# Patient Record
Sex: Female | Born: 1955 | Race: Black or African American | Hispanic: No | Smoking: Never smoker
Health system: Southern US, Community
[De-identification: ages and names within clinical notes are randomized; demographics above are authoritative.]

## PROBLEM LIST (undated history)

## (undated) DIAGNOSIS — K2100 Gastro-esophageal reflux disease with esophagitis, without bleeding: Secondary | ICD-10-CM

## (undated) DIAGNOSIS — E119 Type 2 diabetes mellitus without complications: Secondary | ICD-10-CM

## (undated) DIAGNOSIS — I10 Essential (primary) hypertension: Secondary | ICD-10-CM

## (undated) DIAGNOSIS — H269 Unspecified cataract: Secondary | ICD-10-CM

## (undated) DIAGNOSIS — M1990 Unspecified osteoarthritis, unspecified site: Secondary | ICD-10-CM

## (undated) DIAGNOSIS — E785 Hyperlipidemia, unspecified: Secondary | ICD-10-CM

## (undated) DIAGNOSIS — E559 Vitamin D deficiency, unspecified: Secondary | ICD-10-CM

## (undated) DIAGNOSIS — N201 Calculus of ureter: Secondary | ICD-10-CM

## (undated) DIAGNOSIS — K219 Gastro-esophageal reflux disease without esophagitis: Secondary | ICD-10-CM

## (undated) DIAGNOSIS — E669 Obesity, unspecified: Secondary | ICD-10-CM

## (undated) HISTORY — DX: Gastro-esophageal reflux disease without esophagitis: K21.9

## (undated) HISTORY — DX: Obesity, unspecified: E66.9

## (undated) HISTORY — DX: Vitamin D deficiency, unspecified: E55.9

## (undated) HISTORY — PX: CHOLECYSTECTOMY: SHX55

## (undated) HISTORY — DX: Gastro-esophageal reflux disease with esophagitis: K21.0

## (undated) HISTORY — PX: OTHER SURGICAL HISTORY: SHX169

## (undated) HISTORY — DX: Gastro-esophageal reflux disease with esophagitis, without bleeding: K21.00

## (undated) HISTORY — DX: Unspecified cataract: H26.9

## (undated) HISTORY — PX: HAND SURGERY: SHX662

## (undated) HISTORY — DX: Essential (primary) hypertension: I10

## (undated) HISTORY — DX: Hyperlipidemia, unspecified: E78.5

## (undated) HISTORY — DX: Unspecified osteoarthritis, unspecified site: M19.90

---
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
## 1998-04-19 ENCOUNTER — Ambulatory Visit (HOSPITAL_COMMUNITY): Admission: RE | Admit: 1998-04-19 | Discharge: 1998-04-19 | Payer: Self-pay | Admitting: *Deleted

## 1998-04-21 ENCOUNTER — Other Ambulatory Visit: Admission: RE | Admit: 1998-04-21 | Discharge: 1998-04-21 | Payer: Self-pay | Admitting: *Deleted

## 1998-05-13 ENCOUNTER — Other Ambulatory Visit: Admission: RE | Admit: 1998-05-13 | Discharge: 1998-05-13 | Payer: Self-pay | Admitting: *Deleted

## 1998-09-17 ENCOUNTER — Ambulatory Visit (HOSPITAL_COMMUNITY): Admission: RE | Admit: 1998-09-17 | Discharge: 1998-09-17 | Payer: Self-pay | Admitting: Internal Medicine

## 1999-02-11 ENCOUNTER — Ambulatory Visit (HOSPITAL_COMMUNITY): Admission: RE | Admit: 1999-02-11 | Discharge: 1999-02-11 | Payer: Self-pay | Admitting: Internal Medicine

## 2000-12-11 ENCOUNTER — Encounter: Payer: Self-pay | Admitting: Internal Medicine

## 2000-12-11 ENCOUNTER — Encounter: Admission: RE | Admit: 2000-12-11 | Discharge: 2000-12-11 | Payer: Self-pay | Admitting: Internal Medicine

## 2001-12-19 ENCOUNTER — Encounter: Admission: RE | Admit: 2001-12-19 | Discharge: 2001-12-19 | Payer: Self-pay | Admitting: Internal Medicine

## 2001-12-19 ENCOUNTER — Encounter: Payer: Self-pay | Admitting: Internal Medicine

## 2001-12-25 ENCOUNTER — Emergency Department (HOSPITAL_COMMUNITY): Admission: EM | Admit: 2001-12-25 | Discharge: 2001-12-25 | Payer: Self-pay | Admitting: *Deleted

## 2001-12-25 ENCOUNTER — Encounter: Payer: Self-pay | Admitting: *Deleted

## 2002-01-02 ENCOUNTER — Encounter: Payer: Self-pay | Admitting: Internal Medicine

## 2002-01-02 ENCOUNTER — Ambulatory Visit (HOSPITAL_COMMUNITY): Admission: RE | Admit: 2002-01-02 | Discharge: 2002-01-02 | Payer: Self-pay | Admitting: Internal Medicine

## 2003-09-27 ENCOUNTER — Encounter: Admission: RE | Admit: 2003-09-27 | Discharge: 2003-09-27 | Payer: Self-pay | Admitting: Internal Medicine

## 2003-09-27 ENCOUNTER — Encounter: Payer: Self-pay | Admitting: Internal Medicine

## 2003-12-15 ENCOUNTER — Other Ambulatory Visit: Admission: RE | Admit: 2003-12-15 | Discharge: 2003-12-15 | Payer: Self-pay | Admitting: *Deleted

## 2004-12-05 ENCOUNTER — Other Ambulatory Visit: Admission: RE | Admit: 2004-12-05 | Discharge: 2004-12-05 | Payer: Self-pay | Admitting: *Deleted

## 2005-10-14 ENCOUNTER — Emergency Department (HOSPITAL_COMMUNITY): Admission: EM | Admit: 2005-10-14 | Discharge: 2005-10-14 | Payer: Self-pay | Admitting: Emergency Medicine

## 2006-01-25 ENCOUNTER — Emergency Department (HOSPITAL_COMMUNITY): Admission: EM | Admit: 2006-01-25 | Discharge: 2006-01-26 | Payer: Self-pay | Admitting: Emergency Medicine

## 2006-03-29 ENCOUNTER — Emergency Department (HOSPITAL_COMMUNITY): Admission: EM | Admit: 2006-03-29 | Discharge: 2006-03-29 | Payer: Self-pay | Admitting: Family Medicine

## 2007-09-02 ENCOUNTER — Other Ambulatory Visit: Admission: RE | Admit: 2007-09-02 | Discharge: 2007-09-02 | Payer: Self-pay | Admitting: *Deleted

## 2008-10-25 ENCOUNTER — Other Ambulatory Visit: Admission: RE | Admit: 2008-10-25 | Discharge: 2008-10-25 | Payer: Self-pay | Admitting: Gynecology

## 2009-02-07 ENCOUNTER — Encounter: Admission: RE | Admit: 2009-02-07 | Discharge: 2009-02-07 | Payer: Self-pay | Admitting: Internal Medicine

## 2009-02-15 ENCOUNTER — Inpatient Hospital Stay (HOSPITAL_COMMUNITY): Admission: AD | Admit: 2009-02-15 | Discharge: 2009-02-17 | Payer: Self-pay | Admitting: Internal Medicine

## 2009-02-16 ENCOUNTER — Encounter (INDEPENDENT_AMBULATORY_CARE_PROVIDER_SITE_OTHER): Payer: Self-pay | Admitting: General Surgery

## 2009-05-28 ENCOUNTER — Emergency Department (HOSPITAL_COMMUNITY): Admission: EM | Admit: 2009-05-28 | Discharge: 2009-05-28 | Payer: Self-pay | Admitting: Family Medicine

## 2010-02-27 ENCOUNTER — Encounter: Admission: RE | Admit: 2010-02-27 | Discharge: 2010-02-27 | Payer: Self-pay | Admitting: Gynecology

## 2010-08-22 ENCOUNTER — Encounter: Admission: RE | Admit: 2010-08-22 | Discharge: 2010-08-22 | Payer: Self-pay | Admitting: Gynecology

## 2011-01-07 ENCOUNTER — Encounter: Payer: Self-pay | Admitting: Gynecology

## 2011-02-12 ENCOUNTER — Emergency Department (HOSPITAL_COMMUNITY): Admission: EM | Admit: 2011-02-12 | Payer: Self-pay | Source: Home / Self Care

## 2011-02-26 ENCOUNTER — Other Ambulatory Visit: Payer: Self-pay | Admitting: Gynecology

## 2011-02-26 DIAGNOSIS — Z09 Encounter for follow-up examination after completed treatment for conditions other than malignant neoplasm: Secondary | ICD-10-CM

## 2011-03-16 ENCOUNTER — Ambulatory Visit
Admission: RE | Admit: 2011-03-16 | Discharge: 2011-03-16 | Disposition: A | Discharge status: Home / Self Care | Payer: Medicaid Other | Source: Ambulatory Visit | Attending: Gynecology | Admitting: Gynecology

## 2011-03-16 DIAGNOSIS — Z09 Encounter for follow-up examination after completed treatment for conditions other than malignant neoplasm: Secondary | ICD-10-CM

## 2011-03-29 LAB — COMPREHENSIVE METABOLIC PANEL
ALT: 25 U/L (ref 0–35)
ALT: 56 U/L — ABNORMAL HIGH (ref 0–35)
AST: 53 U/L — ABNORMAL HIGH (ref 0–37)
Alkaline Phosphatase: 56 U/L (ref 39–117)
CO2: 23 mEq/L (ref 19–32)
Calcium: 8.6 mg/dL (ref 8.4–10.5)
Chloride: 103 mEq/L (ref 96–112)
GFR calc Af Amer: >60 mL/min (ref >60)
Glucose, Bld: 93 mg/dL (ref 70–99)
Potassium: 2.8 mEq/L — ABNORMAL LOW (ref 3.5–5.1)
Potassium: 3 mEq/L — ABNORMAL LOW (ref 3.5–5.1)
Sodium: 134 mEq/L — ABNORMAL LOW (ref 135–145)
Sodium: 137 mEq/L (ref 135–145)
Total Bilirubin: 0.8 mg/dL (ref 0.3–1.2)
Total Protein: 7.1 g/dL (ref 6.0–8.3)
Total Protein: 8.4 g/dL — ABNORMAL HIGH (ref 6.0–8.3)

## 2011-03-29 LAB — DIFFERENTIAL
Basophils Absolute: 0 10*3/uL (ref 0.0–0.1)
Basophils Relative: 0 % (ref 0–1)
Eosinophils Absolute: 0 10*3/uL (ref 0.0–0.7)
Monocytes Relative: 5 % (ref 3–12)
Neutrophils Relative %: 49 % (ref 43–77)

## 2011-03-29 LAB — CBC
HCT: 43.3 % (ref 36.0–46.0)
Hemoglobin: 14.8 g/dL (ref 12.0–15.0)
RBC: 4.95 MIL/uL (ref 3.87–5.11)
RDW: 14.1 % (ref 11.5–15.5)
WBC: 8.1 10*3/uL (ref 4.0–10.5)

## 2011-03-29 LAB — GLUCOSE, CAPILLARY
Glucose-Capillary: 108 mg/dL — ABNORMAL HIGH (ref 70–99)
Glucose-Capillary: 152 mg/dL — ABNORMAL HIGH (ref 70–99)

## 2011-03-29 LAB — AMYLASE: Amylase: 89 U/L (ref 27–131)

## 2011-03-29 LAB — PROTIME-INR: INR: 1 (ref 0.00–1.49)

## 2011-04-02 ENCOUNTER — Encounter: Payer: Self-pay | Admitting: Internal Medicine

## 2011-05-01 NOTE — Op Note (Signed)
NAMEAUDRA, [name]               ACCOUNT NO.:  [id number]   MEDICAL RECORD NO.:  [id number]          PATIENT TYPE:  INP   LOCATION:  5523                         FACILITY:  [facility]   PHYSICIAN:  Gabrielle Dare. Janee Morn, M.D.DATE OF BIRTH:  12-12-1956   DATE OF PROCEDURE:  02/16/2009  DATE OF DISCHARGE:                               OPERATIVE REPORT   PREOPERATIVE DIAGNOSIS:  Acute cholecystitis.   POSTOPERATIVE DIAGNOSIS:  Acute cholecystitis.   PROCEDURE:  Laparoscopic cholecystectomy with intraoperative  cholangiogram.   SURGEON:  Gabrielle Dare. Janee Morn, MD.   ASSISTANT:  Junious Silk, NP.   ANESTHESIA:  General endotracheal.   HISTORY OF PRESENT ILLNESS:  Ms. Bunker is a 55 year old African-  American female who has had episodic right upper quadrant pain.  She was  admitted by Dr. August Saucer.  Previous ultrasound demonstrated gallstones and  she continues to have right upper quadrant pain with nausea.  We are  proceeding with cholecystectomy today.   PROCEDURE IN DETAIL:  Informed consent was obtained.  The patient was  identified in the preop holding area.  She was receiving intravenous  antibiotics.  She was brought to the operating room.  General  endotracheal anesthesia was administered by the anesthesia staff.  Her  abdomen was prepped and draped in a sterile fashion.  Supraumbilical  region was infiltrated with 0.25% Marcaine with epinephrine.  Supraumbilical incision was made.  Subcutaneous tissues were dissected  down revealing the anterior fascia.  This was divided sharply and the  peritoneal cavity was entered under direct vision without difficulty.  A  0 Vicryl pursestring suture was placed around the fascial opening and a  Hasson trocar was inserted into the abdomen.  The abdomen was  insufflated with carbon dioxide under direct vision.  An 11-mm  epigastric and two 5-mm lateral ports were placed.  A 0.25% Marcaine  with epinephrine was injected at all port sites.  The dome  of the  gallbladder was retracted superomedially.  The infundibulum was  retracted inferolaterally.  Dissection began laterally and progressed  medially, easily identifying the cystic duct and the cystic artery.  Cystic duct was dissected out until a large window was created between  the cystic duct, the infundibulum, the gallbladder, and the liver.  Once  this was done with excellent visualization, a clip was placed around the  infundibular-cystic duct junction and a small nick was made in the  cystic duct.  Reddick cholangiogram catheter was inserted.  Intraoperative cholangiogram was obtained, demonstrated no common bile  filling defects and good flow of contrast into the duodenum.  Cholangiogram catheter was removed.  Three clips were placed proximally  in the cystic duct and it was divided.  The cystic artery was further  dissected out.  This was clipped twice proximally, once distally, and  divided.  The gallbladder was taken off the liver bed with Bovie cautery  achieving good hemostasis along the way, the gallbladder was placed in  an EndoCatch bag and removed from the abdomen via the supraumbilical  port site.  The liver bed was then cauterized to achieve excellent  hemostasis.  The abdomen was copiously irrigated.  Irrigation fluid  returned clear.  The liver bed was rechecked and was completely dry.  The clips remained in excellent position.  The remainder of the  irrigation fluid was evacuated and it was clear.  The ports were then  removed under direct vision.  The pneumoperitoneum was released.  The  supraumbilical fascia was closed at that time with 0 Vicryl purse-string  suture with care not to trap any intraabdominal contents.  All 4 wounds  were  copiously irrigated.  The skin edges were closed with running 4-0 Vicryl  subcuticular stitch.  Sponge, needle, and instrument counts were  correct.  Dermabond was also applied to the wound.  The patient  tolerated the  procedure well without apparent complications.  The  patient was taken to the recovery room in stable condition.      Gabrielle Dare Janee Morn, M.D.  Electronically Signed     BET/MEDQ  D:  02/16/2009  T:  02/17/2009  Job:  [id number]   cc:   Minerva Areola L. August Saucer, M.D.

## 2011-05-01 NOTE — H&P (Signed)
[name], [name]               ACCOUNT NO.:  [id number]   MEDICAL RECORD NO.:  [id number]          PATIENT TYPE:  INP   LOCATION:  5523                         FACILITY:  [facility]   PHYSICIAN:  Eric L. August Saucer, M.D.     DATE OF BIRTH:  [birth year]   DATE OF ADMISSION:  02/15/2009  DATE OF DISCHARGE:                              HISTORY & PHYSICAL   CHIEF COMPLAINT:  Increasing abdominal pain.   HISTORY OF PRESENT ILLNESS:  This is the first recent [facility] admission for this 55 year old single black female with a  history of mental retardation, esophageal reflux disease, and obesity.  She also has a mild diabetes mellitus, controlled with oral medication.  She had been doing well up until the past 2 weeks.  Family noted that  she had been complaining of intermittent upper abdominal aching-type  pain.  This was poorly described by the patient.  She, however, noted  increasing symptoms over the past week.  The patient was seen on  February 07, 2009, in the office for evaluation.  An abdominal  ultrasound did confirm multiple gallstones.  The changes were not  consistent with acute cholecystitis at that time.  Since then, the  patient has had ongoing intermittent pain.  Her diet has been modified.  She had been avoiding fried foods, red meat, and fatty foods.  She is  admitted today for further evaluation and treatment of her increasing  pain.  There has been no fever, chills, night sweats, nausea, or  vomiting.   PAST MEDICAL HISTORY:  Otherwise, notable for a history of dysfunctional  uterine bleeding for which she has been on Megace per Gynecology in the  past.  Esophageal reflux disease as noted.   The patient has no known allergies.   PRESENT MEDICATIONS:  1. Metformin 500 mg 2 tablets twice a day.  2. Amaryl 2 mg daily.  3. Maxzide 25 mg daily.  4. Nexium 40 mg daily.  5. Megace 5 mg 2 tablets a day.   PHYSICAL EXAMINATION:  GENERAL;  She is an obese black female  in mild  distress.  VITAL SIGNS:  Temperature of 99, pulse 107, respiratory rate 20, blood  pressure elevated at 171/60, and O2 saturation is 98% on room air.  Height 5 feet 4 inches and weight 99.3 kg.  HEENT:  Head is normocephalic and atraumatic without bruits.  Extraocular muscles are intact.  There is no scleral icterus.  TMs are  clear.  Nose; mild turbinate edema.  Throat; posterior pharynx is clear.  NECK:  Supple.  No enlarged thyroid.  No positive cervical nodes.  LUNGS:  Clear to auscultation.  No E to A changes.  CARDIOVASCULAR:  Normal S1 and S2.  No S3.  ABDOMEN:  Bowel sounds are present.  She does have marked right upper  quadrant tenderness.  No rebound tenderness.  There is also mild  tenderness in the left upper quadrant as well.  No increased warmth of  the abdomen noted.  EXTREMITIES:  Full range of motion.  No cyanosis, clubbing, crepitus, or  edema.  NEUROLOGIC:  She is alert and oriented to person, place, and time.  Moves all extremities.  PSYCHIATRIC:  She has slow mentation, otherwise, which is chronic.   LABORATORY DATA:  From today is pending.  Abdominal ultrasound from  February 07, 2009, showed multiple gallstones.  She had a diffuse fatty  infiltration of liver without ductal dilatation.  Portion of the  pancreas was obscured by gas at that time.   EKG demonstrates normal sinus rhythm with left axis deviation.  Poor R-  wave progression.   IMPRESSION:  1. Cholelithiasis.  Rule out early acute cholecystitis.  2. Abdominal pain secondary to above.  3. History of gastroesophageal reflux disease with negative      Helicobacter pylori titers in the past.  4. Obesity.  5. Mentally challenged, chronic.  6. History of dysfunctional uterine bleeding.   PLAN:  The patient is admitted at this time for further evaluation.  We  will put at bowel rest at this time.  IV antibiotics.  Surgical  consultation for cholecystectomy either lap cholecystectomy versus   direct.  We will monitor closely for possible acute cholecystitis.  Continue her other medications as before.            ______________________________  Lind Guest. August Saucer, M.D.     ELD/MEDQ  D:  02/15/2009  T:  02/16/2009  Job:  [id number]

## 2011-05-01 NOTE — Consult Note (Signed)
[name], BEECHAM               ACCOUNT NO.:  [id number]   MEDICAL RECORD NO.:  [id number]          PATIENT TYPE:  INP   LOCATION:  5523                         FACILITY:  [facility]   PHYSICIAN:  Ollen Gross. Vernell Morgans, M.D. DATE OF BIRTH:  Sep 09, 1956   DATE OF CONSULTATION:  02/15/2009  DATE OF DISCHARGE:                                 CONSULTATION   CHIEF COMPLAINT:  Right upper quadrant pain.   Ms. Lenk is a 55 year old black female who states that she has been  having right upper quadrant pain for years.  Pain is fairly constant,  does not really go away as to the pain has not been associated with any  nausea or vomiting.  She is not running any fevers.  Her bowels are  moving fairly regular.  She did have an ultrasound done about 8 days  ago, which showed some stones in her gallbladder, but no gallbladder  wall thickening or ductal dilatation.  She has had no recent lab work,  but she is admitted by the medical team for this.   PAST MEDICAL HISTORY:  Significant for diabetes, mental retardation.   PAST SURGICAL HISTORY:  D&C.   MEDICATIONS:  Metformin.   ALLERGIES:  No known drug allergies.   SOCIAL HISTORY:  She denies use of alcohol or tobacco products.   FAMILY HISTORY:  Noncontributory.   PHYSICAL EXAMINATION:  VITAL SIGNS:  Her temp is 99.3.  Vitals are  stable.  GENERAL:  She is a well-developed, well-nourished black female in no  acute distress.  SKIN:  Warm and dry, no jaundice.  HEENT:  Extraocular movements intact.  Pupils equal, round, and reactive  to light.  Sclerae nonicteric.  LUNGS:  Clear bilaterally with no use of accessory inspiratory muscles.  HEART:  Regular rate and rhythm with impulse in left chest.  ABDOMEN:  Soft with some mild right upper quadrant tenderness, but no  palpable mass.  No guarding, no peritonitis.  EXTREMITIES:  No cyanosis,  clubbing, or edema with good strength in arms and legs.  PSYCHOLOGICAL:  She is alert and oriented x3 with  no evidence of anxiety  or depression.   Again, she had an ultrasound done 8 days ago which showed gallstones,  but no gallbladder wall thickening or duct dilatation.  She has had no  recent lab work.   ASSESSMENT AND PLAN:  This is a 55 year old black female with what  sounds like symptomatic gallstones that has been going on for a long  time.  She will need her workup finished with liver function tests,  pancreatic enzymes,  white count, hemoglobin.  If she truly has uncomplicated gallstones  without anything else going on then she may benefit at some point from  cholecystectomy.  We will follow up on the rest of her workup and then  proceed accordingly.      Ollen Gross. Vernell Morgans, M.D.  Electronically Signed     PST/MEDQ  D:  02/15/2009  T:  02/16/2009  Job:  [id number]   cc:   Dr. August Saucer

## 2011-05-04 NOTE — Discharge Summary (Signed)
[name], [name]               ACCOUNT NO.:  [id number]   MEDICAL RECORD NO.:  [id number]          PATIENT TYPE:  INP   LOCATION:  5523                         FACILITY:  [facility]   PHYSICIAN:  Eric L. August Saucer, M.D.     DATE OF BIRTH:  1956/10/29   DATE OF ADMISSION:  02/15/2009  DATE OF DISCHARGE:  02/17/2009                               DISCHARGE SUMMARY   FINAL DIAGNOSES:  1. Cholelithiasis with acute cholecystitis, 574.00.  2. Esophageal reflux, 530.81.  3. Diabetes mellitus, type 2, non-insulin dependent, 250.00.  4. Hypertension, 401.9  5. Mental retardation, V19.  6. Hypokalemia, 276.8  7. Menstrual disorder, 626.8.  8. Obesity, 278.00.   OPERATIONS AND PROCEDURES:  1. Laparoscopic cholecystectomy per Dr. Violeta Gelinas.  2. Intraoperative cholangiogram per Dr. Violeta Gelinas.   HISTORY OF PRESENT ILLNESS:  This was the first recent [facility] admission for this 55 year old single black female with a  history of mental retardation, esophageal reflux disease, and obesity.  The patient has had mild diabetes mellitus which was controlled on oral  medication.  The patient had been doing well up until the past 2 weeks.  Family noted that she began complaining of intermittent upper abdominal  aching pain.  This was initially poorly described by the patient,  however, it became increasingly worse over the subsequent week.  The  patient was seen on February 07, 2009, in the office for evaluation.  Abdominal ultrasound did confirm multiple gallstones.  Changes, however,  were not consistent with acute cholecystitis.  Since that time, the  patient has had intermittent abdominal pain.  Her diet had been  modified, to avoid red meat, and fatty foods.  Despite this, however,  since progressed and the patient was admitted thereafter for further  evaluation.   PAST MEDICAL HISTORY AND PHYSICAL EXAM:  Per admission H&P.   HOSPITAL COURSE:  The patient is admitted for further  evaluation of  increasing abdominal pain.  She was placed on IV fluids with bedrest.  In lieu of probable cholecystitis, she was seen in consultation by  Surgery.  She was first seen by Dr. Carolynne Edouard.  On the subsequent day, she  was reevaluated by Dr. Violeta Gelinas.  It is felt that clinically she  had evidence for acute cholecystitis and need of urgent lap  cholecystectomy.  This was discussed with family and subsequently  performed on February 16, 2009.  The patient tolerated the procedure well.  On the subsequent day, she continued to progress.  Her diet was  gradually increased.  She tolerated this well.  By February 17, 2009, the  patient was ambulatory and feeling much better.  She was subsequent felt  to be stable for discharge.   DISCHARGE MEDICATIONS:  1. Metformin 500 mg b.i.d.  2. Amaryl 2 mg daily.  3. Maxzide 25 mg daily.  4. Nexium 40 mg daily.  5. Megace 5 mg 2 times daily.   The patient is to advance the diet as tolerated.  She will need to  follow up with Dr. Janee Morn in 2 weeks.  She will  be seen in the office  in 2 weeks' time for followup as well.           ______________________________  Lind Guest. August Saucer, M.D.     ELD/MEDQ  D:  04/06/2009  T:  04/07/2009  Job:  [id number]

## 2012-01-15 ENCOUNTER — Other Ambulatory Visit: Payer: Self-pay | Admitting: Gynecology

## 2012-01-15 DIAGNOSIS — Z1231 Encounter for screening mammogram for malignant neoplasm of breast: Secondary | ICD-10-CM

## 2012-03-17 ENCOUNTER — Ambulatory Visit
Admission: RE | Admit: 2012-03-17 | Discharge: 2012-03-17 | Disposition: A | Discharge status: Home / Self Care | Payer: Medicare Other | Source: Ambulatory Visit | Attending: Gynecology | Admitting: Gynecology

## 2012-03-17 ENCOUNTER — Ambulatory Visit: Payer: Medicaid Other

## 2012-03-17 DIAGNOSIS — Z1231 Encounter for screening mammogram for malignant neoplasm of breast: Secondary | ICD-10-CM

## 2012-07-28 ENCOUNTER — Ambulatory Visit
Admission: RE | Admit: 2012-07-28 | Discharge: 2012-07-28 | Disposition: A | Discharge status: Home / Self Care | Payer: Medicare Other | Source: Ambulatory Visit | Attending: Internal Medicine | Admitting: Internal Medicine

## 2012-07-28 ENCOUNTER — Other Ambulatory Visit: Payer: Self-pay | Admitting: Internal Medicine

## 2012-07-28 DIAGNOSIS — R52 Pain, unspecified: Secondary | ICD-10-CM

## 2012-10-14 LAB — HEPATIC FUNCTION PANEL
ALT: 19 U/L (ref 7–35)
Alkaline Phosphatase: 67 U/L (ref 25–125)

## 2012-10-14 LAB — BASIC METABOLIC PANEL
Creatinine: 0.9 mg/dL (ref 0.5–1.1)
Potassium: 3.7 mmol/L (ref 3.4–5.3)
Sodium: 139 mmol/L (ref 137–147)

## 2013-01-07 ENCOUNTER — Encounter: Payer: Self-pay | Admitting: Hematology

## 2015-12-29 ENCOUNTER — Emergency Department (HOSPITAL_COMMUNITY)
Admission: EM | Admit: 2015-12-29 | Discharge: 2015-12-29 | Disposition: A | Discharge status: Home / Self Care | Payer: Medicare Other | Attending: Emergency Medicine | Admitting: Emergency Medicine

## 2015-12-29 ENCOUNTER — Encounter (HOSPITAL_COMMUNITY): Payer: Self-pay | Admitting: Emergency Medicine

## 2015-12-29 DIAGNOSIS — I10 Essential (primary) hypertension: Secondary | ICD-10-CM | POA: Diagnosis not present

## 2015-12-29 DIAGNOSIS — E559 Vitamin D deficiency, unspecified: Secondary | ICD-10-CM | POA: Diagnosis not present

## 2015-12-29 DIAGNOSIS — N3000 Acute cystitis without hematuria: Secondary | ICD-10-CM | POA: Diagnosis not present

## 2015-12-29 DIAGNOSIS — R197 Diarrhea, unspecified: Secondary | ICD-10-CM | POA: Diagnosis not present

## 2015-12-29 DIAGNOSIS — Z8719 Personal history of other diseases of the digestive system: Secondary | ICD-10-CM | POA: Diagnosis not present

## 2015-12-29 DIAGNOSIS — Z7984 Long term (current) use of oral hypoglycemic drugs: Secondary | ICD-10-CM | POA: Insufficient documentation

## 2015-12-29 DIAGNOSIS — E1165 Type 2 diabetes mellitus with hyperglycemia: Secondary | ICD-10-CM | POA: Diagnosis not present

## 2015-12-29 DIAGNOSIS — Z79899 Other long term (current) drug therapy: Secondary | ICD-10-CM | POA: Insufficient documentation

## 2015-12-29 DIAGNOSIS — R739 Hyperglycemia, unspecified: Secondary | ICD-10-CM

## 2015-12-29 DIAGNOSIS — E669 Obesity, unspecified: Secondary | ICD-10-CM | POA: Diagnosis not present

## 2015-12-29 LAB — CBG MONITORING, ED
GLUCOSE-CAPILLARY: 250 mg/dL — AB (ref 65–99)
Glucose-Capillary: 264 mg/dL — ABNORMAL HIGH (ref 65–99)
Glucose-Capillary: 220 mg/dL — ABNORMAL HIGH (ref 65–99)

## 2015-12-29 LAB — CBC
HCT: 48 % — ABNORMAL HIGH (ref 36.0–46.0)
Hemoglobin: 16.2 g/dL — ABNORMAL HIGH (ref 12.0–15.0)
MCH: 29.5 pg (ref 26.0–34.0)
MCHC: 33.8 g/dL (ref 30.0–36.0)
MCV: 87.3 fL (ref 78.0–100.0)
Platelets: 290 10*3/uL (ref 150–400)
RBC: 5.5 MIL/uL — ABNORMAL HIGH (ref 3.87–5.11)
RDW: 13.1 % (ref 11.5–15.5)
WBC: 6.4 10*3/uL (ref 4.0–10.5)

## 2015-12-29 LAB — URINE MICROSCOPIC-ADD ON

## 2015-12-29 LAB — URINALYSIS, ROUTINE W REFLEX MICROSCOPIC
Glucose, UA: >1000 mg/dL — AB
Hgb urine dipstick: NEGATIVE
Ketones, ur: 15 mg/dL — AB
Leukocytes, UA: NEGATIVE
Nitrite: NEGATIVE
Protein, ur: 30 mg/dL — AB
Specific Gravity, Urine: 1.037 — ABNORMAL HIGH (ref 1.005–1.030)
pH: 5.5 (ref 5.0–8.0)

## 2015-12-29 LAB — BASIC METABOLIC PANEL
Anion gap: 12 (ref 5–15)
BUN: 14 mg/dL (ref 6–20)
CO2: 29 mmol/L (ref 22–32)
Calcium: 9.9 mg/dL (ref 8.9–10.3)
Chloride: 99 mmol/L — ABNORMAL LOW (ref 101–111)
Creatinine, Ser: 0.91 mg/dL (ref 0.44–1.00)
GFR calc Af Amer: >60 mL/min (ref >60)
GFR calc non Af Amer: >60 mL/min (ref >60)
Glucose, Bld: 260 mg/dL — ABNORMAL HIGH (ref 65–99)
Potassium: 3.3 mmol/L — ABNORMAL LOW (ref 3.5–5.1)
Sodium: 140 mmol/L (ref 135–145)

## 2015-12-29 MED ORDER — CEPHALEXIN 500 MG PO CAPS
1000.0000 mg | ORAL_CAPSULE | Freq: Two times a day (BID) | ORAL | Status: DC
Start: 2015-12-29 — End: 2019-06-23

## 2015-12-29 NOTE — Discharge Instructions (Signed)
Return here as needed.  Follow-up with your primary care doctor, you do have a urinary tract infection.  This could also be causing slight elevations in your Blood sugar increase your fluid intake, rest as much as possible

## 2015-12-29 NOTE — ED Notes (Signed)
Family states pt's glucose monitor has not been functioning properly for an unknown amount of time and when she went to the dr she had glucose in her urine and the dr wanted her to get her A1C rechecked  Family states the place she went was closed so she was going to take her somewhere in the morning to have that done but in the meantime she started having diarrhea and there was some blood noted so they decided to come here and have it all assessed

## 2015-12-29 NOTE — ED Provider Notes (Signed)
CSN: [id number]     Arrival date & time 12/29/15  0410 History   First MD Initiated Contact with Patient 12/29/15 [phone]     Chief Complaint  Patient presents with  . Hyperglycemia  . Rectal Bleeding     (Consider location/radiation/quality/duration/timing/severity/associated sxs/prior Treatment) HPI Patient presents to the emergency department with elevated blood sugar, along with diarrhea that started this morning.  The patient was seen by her primary care doctor recently due to elevated blood sugars.  Patient currently is on metformin and glimepiride for her diabetes.  The patient's history is mostly provided by her sister who is also her caregiver.  Patient does not have any chest pain, shortness of breath, weakness, dizziness, headache, blurred vision, back pain, neck pain, fever, dysuria, incontinence, bloody stool, hematemesis, edema, lethargy, lightheadedness, abdominal pain, or syncope.  The patient states nothing seems make her condition better or worse, states she had 4 episodes of diarrhea and noticed some blood mixed with the diarrhea on 2 occasions Past Medical History  Diagnosis Date  . Diabetes mellitus   . Hyperlipidemia   . Hypertension   . Unspecified vitamin D deficiency   . Obesity, unspecified   . Reflux esophagitis    Past Surgical History  Procedure Laterality Date  . Cholecystectomy     Family History  Problem Relation Age of Onset  . Diabetes Mother   . Hypertension Mother   . Diabetes Father   . Hypertension Father    Social History  Substance Use Topics  . Smoking status: Never Smoker   . Smokeless tobacco: None  . Alcohol Use: No   OB History    No data available     Review of Systems  All other systems negative except as documented in the HPI. All pertinent positives and negatives as reviewed in the HPI.  Allergies  Review of patient's allergies indicates no known allergies.  Home Medications   Prior to Admission medications    Medication Sig Start Date End Date Taking? Authorizing Provider  cholecalciferol (VITAMIN D) 1000 units tablet Take 1,000 Units by mouth daily.   Yes Historical Provider, MD  glimepiride (AMARYL) 2 MG tablet Take 2 mg by mouth daily with breakfast.   Yes Historical Provider, MD  ibuprofen (ADVIL,MOTRIN) 200 MG tablet Take 400 mg by mouth every 6 (six) hours as needed for fever, headache, mild pain, moderate pain or cramping.   Yes Historical Provider, MD  metFORMIN (GLUCOPHAGE) 1000 MG tablet Take 1,000 mg by mouth 2 (two) times daily with a meal.    Yes Historical Provider, MD  triamterene-hydrochlorothiazide (MAXZIDE-25) 37.5-25 MG per tablet Take 1 tablet by mouth daily.   Yes Historical Provider, MD   BP 126/75 mmHg  Pulse 84  Temp(Src) 98.4 F (36.9 C) (Oral)  Resp 20  Ht 5\' 5"  (1.651 m)  Wt 86.183 kg  BMI 31.62 kg/m2  SpO2 98% Physical Exam  Constitutional: She is oriented to person, place, and time. She appears well-developed and well-nourished. No distress.  HENT:  Head: Normocephalic and atraumatic.  Mouth/Throat: Oropharynx is clear and moist.  Eyes: Pupils are equal, round, and reactive to light.  Neck: Normal range of motion. Neck supple.  Cardiovascular: Normal rate, regular rhythm and normal heart sounds.  Exam reveals no gallop and no friction rub.   No murmur heard. Pulmonary/Chest: Effort normal and breath sounds normal. No respiratory distress. She has no wheezes.  Abdominal: Soft. Bowel sounds are normal. She exhibits no distension. There is no  tenderness.  Neurological: She is alert and oriented to person, place, and time. She exhibits normal muscle tone. Coordination normal.  Skin: Skin is warm and dry. No rash noted. No erythema.  Psychiatric: She has a normal mood and affect. Her behavior is normal.  Nursing note and vitals reviewed.   ED Course  Procedures (including critical care time) Labs Review Labs Reviewed  BASIC METABOLIC PANEL - Abnormal; Notable  for the following:    Potassium 3.3 (*)    Chloride 99 (*)    Glucose, Bld 260 (*)    All other components within normal limits  CBC - Abnormal; Notable for the following:    RBC 5.50 (*)    Hemoglobin 16.2 (*)    HCT 48.0 (*)    All other components within normal limits  URINALYSIS, ROUTINE W REFLEX MICROSCOPIC (NOT AT [facility]) - Abnormal; Notable for the following:    APPearance CLOUDY (*)    Specific Gravity, Urine 1.037 (*)    Glucose, UA >1000 (*)    Bilirubin Urine SMALL (*)    Ketones, ur 15 (*)    Protein, ur 30 (*)    All other components within normal limits  URINE MICROSCOPIC-ADD ON - Abnormal; Notable for the following:    Squamous Epithelial / LPF 0-5 (*)    Bacteria, UA MANY (*)    Casts HYALINE CASTS (*)    All other components within normal limits  CBG MONITORING, ED - Abnormal; Notable for the following:    Glucose-Capillary 264 (*)    All other components within normal limits  CBG MONITORING, ED - Abnormal; Notable for the following:    Glucose-Capillary 250 (*)    All other components within normal limits  CBG MONITORING, ED - Abnormal; Notable for the following:    Glucose-Capillary 220 (*)    All other components within normal limits    Imaging Review No results found. I have personally reviewed and evaluated these images and lab results as part of my medical decision-making.   EKG Interpretation None      Patient has a urinary tract infection with patient follow-up with her primary care Dr. told to return here as needed.  Patient agrees the plan and all questions were answered.  I feel that this scant amount of blood noted in the diarrhea was due to the irritation of the area rather than any GI bleeding.  She has not had any further episodes.  The patient's vital signs remained stable.  She is not hypotensive or tachycardic.    Charlestine NightChristopher Nivan Melendrez, PA-C 12/29/15 1229  Raeford RazorStephen Kohut, MD 12/29/15 1240

## 2015-12-30 LAB — URINE CULTURE

## 2018-08-31 ENCOUNTER — Encounter (HOSPITAL_COMMUNITY): Payer: Self-pay

## 2018-08-31 ENCOUNTER — Other Ambulatory Visit: Payer: Self-pay

## 2018-08-31 ENCOUNTER — Ambulatory Visit (HOSPITAL_COMMUNITY)
Admission: EM | Admit: 2018-08-31 | Discharge: 2018-08-31 | Disposition: A | Discharge status: Home / Self Care | Payer: Medicare Other | Attending: Internal Medicine | Admitting: Internal Medicine

## 2018-08-31 ENCOUNTER — Ambulatory Visit (INDEPENDENT_AMBULATORY_CARE_PROVIDER_SITE_OTHER): Payer: Medicare Other

## 2018-08-31 DIAGNOSIS — S91301A Unspecified open wound, right foot, initial encounter: Secondary | ICD-10-CM

## 2018-08-31 DIAGNOSIS — S91331A Puncture wound without foreign body, right foot, initial encounter: Secondary | ICD-10-CM | POA: Diagnosis not present

## 2018-08-31 DIAGNOSIS — W268XXA Contact with other sharp object(s), not elsewhere classified, initial encounter: Secondary | ICD-10-CM

## 2018-08-31 NOTE — ED Triage Notes (Signed)
Pt states she has a spot under her right foot x 2 week.

## 2018-08-31 NOTE — ED Provider Notes (Signed)
[facility]    CSN: [id number] Arrival date & time: 08/31/18  4098     History   Chief Complaint Chief Complaint  Patient presents with  . Foot Pain    HPI [name] is a 62 y.o. female.   62 year old female with history of DM, HLD, HTN comes in with family members for 2-day history of foot pain.  Patient stepped on a toothpick 2 weeks ago.  At that time, so primary care provider, and was given Augmentin for the symptoms.  Patient had been acting normal until 2 days ago, started limping due to pain.  Family member states unsure if foreign body is still in the foot, but was advised by PCP that we could get an x-ray.  No surrounding erythema, warmth.  Denies fever, chills, night sweats.  No obvious numbness, tingling.  Prior to foot pain starting, had not do anything to the area apart from taking Augmentin.  Family member states, has a refill on the Augmentin, and patient started back on it 2 days ago.     Past Medical History:  Diagnosis Date  . Diabetes mellitus   . Hyperlipidemia   . Hypertension   . Obesity, unspecified   . Reflux esophagitis   . Unspecified vitamin D deficiency     There are no active problems to display for this patient.   Past Surgical History:  Procedure Laterality Date  . CHOLECYSTECTOMY      OB History   None      Home Medications    Prior to Admission medications   Medication Sig Start Date End Date Taking? Authorizing Provider  amoxicillin (AMOXIL) 875 MG tablet Take 875 mg by mouth 2 (two) times daily.   Yes [provider]  cephALEXin (KEFLEX) 500 MG capsule Take 2 capsules (1,000 mg total) by mouth 2 (two) times daily. Patient not taking: Reported on 08/31/2018 12/29/15   Charlestine Night, PA-C  cholecalciferol (VITAMIN D) 1000 units tablet Take 1,000 Units by mouth daily.    [provider]  glimepiride (AMARYL) 2 MG tablet Take 2 mg by mouth daily with breakfast.    [provider]    ibuprofen (ADVIL,MOTRIN) 200 MG tablet Take 400 mg by mouth every 6 (six) hours as needed for fever, headache, mild pain, moderate pain or cramping.    [provider]  metFORMIN (GLUCOPHAGE) 1000 MG tablet Take 1,000 mg by mouth 2 (two) times daily with a meal.     [provider]  triamterene-hydrochlorothiazide (MAXZIDE-25) 37.5-25 MG per tablet Take 1 tablet by mouth daily.    [provider]    Family History Family History  Problem Relation Age of Onset  . Diabetes Mother   . Hypertension Mother   . Diabetes Father   . Hypertension Father     Social History Social History   Tobacco Use  . Smoking status: Never Smoker  Substance Use Topics  . Alcohol use: No  . Drug use: No     Allergies   Patient has no known allergies.   Review of Systems Review of Systems  Reason unable to perform ROS: See HPI as above.     Physical Exam Triage Vital Signs ED Triage Vitals  Enc Vitals Group     BP 08/31/18 1012 (!) 143/76     Pulse Rate 08/31/18 1012 87     Resp 08/31/18 1012 18     Temp 08/31/18 1012 98.7 F (37.1 C)  Temp src --      SpO2 08/31/18 1012 98 %     Weight 08/31/18 1013 155 lb (70.3 kg)     Height --      Head Circumference --      Peak Flow --      Pain Score --      Pain Loc --      Pain Edu? --      Excl. in GC? --    No data found.  Updated Vital Signs BP (!) 143/76 (BP Location: Left Arm)   Pulse 87   Temp 98.7 F (37.1 C)   Resp 18   Wt 155 lb (70.3 kg)   SpO2 98%   BMI 25.79 kg/m   Physical Exam  Constitutional: She is oriented to person, place, and time. She appears well-developed and well-nourished. No distress.  HENT:  Head: Normocephalic and atraumatic.  Eyes: Pupils are equal, round, and reactive to light. Conjunctivae are normal.  Musculoskeletal:  Puncture site seen to the right plantar surface 3cm from heel. Small hematoma around puncture site seen. No erythema, warmth.   Sensation intact.  Pedal pulse 2+, cap refill <2s  Neurological: She is alert and oriented to person, place, and time.  Skin: She is not diaphoretic.    UC Treatments / Results  Labs (all labs ordered are listed, but only abnormal results are displayed) Labs Reviewed - No data to display  EKG None  Radiology Dg Foot Complete Right  Result Date: 08/31/2018 CLINICAL DATA:  Open wound at plantar aspect near the base of the RIGHT fifth metatarsal bone. Pain. Diabetic. EXAM: RIGHT FOOT COMPLETE - 3+ VIEW COMPARISON:  None. FINDINGS: Skin marker placed at the site of patient's wound. Osseous and soft tissue structures adjacent to the skin marker are unremarkable. No destructive change to suggest osteomyelitis. No fracture line or displaced fracture fragment. No soft tissue gas. Remainder the osseous structures of the RIGHT foot appear intact and normal in mineralization. No acute appearing osseous abnormality. IMPRESSION: No acute findings. Specifically, osseous and soft tissue structures adjacent to the wound site (skin marker in place) are unremarkable. Electronically Signed   By: Bary RichardStan  Maynard M.D.   On: 08/31/2018 11:02    Procedures Procedures (including critical care time)  Medications Ordered in UC Medications - No data to display  Initial Impression / Assessment and Plan / UC Course  I have reviewed the triage vital signs and the nursing notes.  Pertinent labs & imaging results that were available during my care of the patient were reviewed by me and considered in my medical decision making (see chart for details).    X-ray did not show obvious foreign body.  Will have patient dress area with donut dressing to relieve pressure.  Warm compress to help with hematoma.  Patient establish with a podiatrist, to follow-up if symptoms not improving.  Return precautions given.  Family members expresses understanding and agrees to plan.  Final Clinical Impressions(s) / UC Diagnoses   Final diagnoses:  Wound  of right foot    ED Prescriptions    None        Belinda FisherYu, Amy V, PA-C 08/31/18 1151

## 2018-08-31 NOTE — Discharge Instructions (Addendum)
Xray without obvious foreign body. Continue augmentin as directed by PCP. Get Corn bandage/donut dressing to help keep pressure off the area. Warm soaks. Can take ibuprofen for pain. Follow up with podiatry for further evaluation if symptoms not improving.

## 2019-02-26 ENCOUNTER — Other Ambulatory Visit: Payer: Self-pay | Admitting: Internal Medicine

## 2019-02-26 ENCOUNTER — Ambulatory Visit
Admission: RE | Admit: 2019-02-26 | Discharge: 2019-02-26 | Disposition: A | Discharge status: Home / Self Care | Payer: Medicare Other | Source: Ambulatory Visit | Attending: Internal Medicine | Admitting: Internal Medicine

## 2019-02-26 ENCOUNTER — Other Ambulatory Visit: Payer: Self-pay

## 2019-02-26 DIAGNOSIS — M25551 Pain in right hip: Secondary | ICD-10-CM

## 2019-04-20 ENCOUNTER — Ambulatory Visit (INDEPENDENT_AMBULATORY_CARE_PROVIDER_SITE_OTHER): Payer: Medicare Other | Admitting: Cardiology

## 2019-04-20 ENCOUNTER — Encounter: Payer: Self-pay | Admitting: Cardiology

## 2019-04-20 ENCOUNTER — Other Ambulatory Visit: Payer: Self-pay

## 2019-04-20 VITALS — BP 125/80 | HR 91 | Temp 97.3°F | Ht 65.0 in | Wt 190.1 lb

## 2019-04-20 DIAGNOSIS — R002 Palpitations: Secondary | ICD-10-CM

## 2019-04-20 DIAGNOSIS — IMO0002 Reserved for concepts with insufficient information to code with codable children: Secondary | ICD-10-CM

## 2019-04-20 DIAGNOSIS — R9431 Abnormal electrocardiogram [ECG] [EKG]: Secondary | ICD-10-CM | POA: Diagnosis not present

## 2019-04-20 DIAGNOSIS — Z794 Long term (current) use of insulin: Secondary | ICD-10-CM

## 2019-04-20 DIAGNOSIS — I10 Essential (primary) hypertension: Secondary | ICD-10-CM

## 2019-04-20 DIAGNOSIS — E1165 Type 2 diabetes mellitus with hyperglycemia: Secondary | ICD-10-CM

## 2019-04-20 NOTE — Progress Notes (Signed)
Primary Physician/Referring:  Rogers Blocker, MD  Patient ID: [name], female    DOB: 12-19-1955, 63 y.o.   MRN: [id number]  Chief Complaint  Patient presents with  . Abnormal ECG    pt c/o palpitations on and off for 3 weeks   . New Patient (Initial Visit)  . Palpitations    HPI: [name]  is a 63 y.o. female  with hypertension, uncontrolled diabetes, vitamin D deficiency, referred to [place] on a urgent basis by PCP, Dr. Kevan Ny, for evaluation of abnormal EKG.  Except for palpitations that occur when patient gets excited or anxious and describes as racing lasting for a few seconds, patient is asymptomatic. She denies any chest pain, dyspnea on exertion, leg edema, PND, orthopnea, or symptoms suggestive of TIA or claudication. Does have right hip pain found to be related to arthritis.   Patient is fairly active around the house and does do some walking around the house and up and down the driveway. Sister denies any difficulty with doing this.   Denies any former tobacco use. Has family history positive for heart disease in her mother at age 60.   Patient has memory impairment disorder and is cared for by her sister. Sister is present at bedside.  Past Medical History:  Diagnosis Date  . Diabetes mellitus   . Hyperlipidemia   . Hypertension   . Obesity, unspecified   . Reflux esophagitis   . Unspecified vitamin D deficiency     Past Surgical History:  Procedure Laterality Date  . CHOLECYSTECTOMY      Social History   Socioeconomic History  . Marital status: Single    Spouse name: Not on file  . Number of children: Not on file  . Years of education: Not on file  . Highest education level: Not on file  Occupational History  . Not on file  Social Needs  . Financial resource strain: Not on file  . Food insecurity:    Worry: Not on file    Inability: Not on file  . Transportation needs:    Medical: Not on file    Non-medical: Not on file  Tobacco Use  .  Smoking status: Never Smoker  Substance and Sexual Activity  . Alcohol use: No  . Drug use: No  . Sexual activity: Not on file  Lifestyle  . Physical activity:    Days per week: Not on file    Minutes per session: Not on file  . Stress: Not on file  Relationships  . Social connections:    Talks on phone: Not on file    Gets together: Not on file    Attends religious service: Not on file    Active member of club or organization: Not on file    Attends meetings of clubs or organizations: Not on file    Relationship status: Not on file  . Intimate partner violence:    Fear of current or ex partner: Not on file    Emotionally abused: Not on file    Physically abused: Not on file    Forced sexual activity: Not on file  Other Topics Concern  . Not on file  Social History Narrative  . Not on file    Current Outpatient Medications on File Prior to Visit  Medication Sig Dispense Refill  . benazepril (LOTENSIN) 20 MG tablet Take 1 tablet by mouth daily.    . cholecalciferol (VITAMIN D) 1000 units tablet Take 1,000 Units by  mouth daily.    Marland Kitchen ibuprofen (ADVIL,MOTRIN) 200 MG tablet Take 400 mg by mouth every 6 (six) hours as needed for fever, headache, mild pain, moderate pain or cramping.    Marland Kitchen LEVEMIR FLEXTOUCH 100 UNIT/ML Pen Inject 15-19 Units as directed 2 (two) times a day.    . metFORMIN (GLUCOPHAGE) 1000 MG tablet Take 1,000 mg by mouth 2 (two) times daily with a meal.     . omeprazole (PRILOSEC) 20 MG capsule Take 20 mg by mouth daily.    . Potassium Chloride ER 20 MEQ TBCR Take 20 mEq by mouth daily.    . rosuvastatin (CRESTOR) 5 MG tablet Take 5 mg by mouth daily.    Marland Kitchen triamterene-hydrochlorothiazide (MAXZIDE-25) 37.5-25 MG per tablet Take 1 tablet by mouth daily.    Marland Kitchen amoxicillin (AMOXIL) 875 MG tablet Take 875 mg by mouth 2 (two) times daily.    . cephALEXin (KEFLEX) 500 MG capsule Take 2 capsules (1,000 mg total) by mouth 2 (two) times daily. (Patient not taking: Reported on  08/31/2018) 28 capsule 0  . glimepiride (AMARYL) 2 MG tablet Take 2 mg by mouth daily with breakfast.     No current facility-administered medications on file prior to visit.     Review of Systems  Constitution: Negative for decreased appetite, malaise/fatigue, weight gain and weight loss.  Eyes: Negative for visual disturbance.  Cardiovascular: Positive for palpitations (with getting excited). Negative for chest pain, claudication, dyspnea on exertion, leg swelling, orthopnea and syncope.  Respiratory: Negative for hemoptysis and wheezing.   Endocrine: Negative for cold intolerance and heat intolerance.  Hematologic/Lymphatic: Does not bruise/bleed easily.  Skin: Negative for nail changes.  Musculoskeletal: Positive for arthritis. Negative for muscle weakness and myalgias.  Gastrointestinal: Negative for abdominal pain, change in bowel habit, nausea and vomiting.  Neurological: Negative for difficulty with concentration, dizziness, focal weakness and headaches.  Psychiatric/Behavioral: Negative for altered mental status and suicidal ideas.  All other systems reviewed and are negative.     Objective  Blood pressure 125/80, pulse 91, temperature (!) 97.3 F (36.3 C), height '5\' 5"'  (1.651 m), weight 190 lb 1.6 oz (86.2 kg), SpO2 96 %. Body mass index is 31.63 kg/m.    Physical Exam  Constitutional: She is oriented to person, place, and time. Vital signs are normal. She appears well-developed and well-nourished.  HENT:  Head: Normocephalic and atraumatic.  Neck: Normal range of motion.  Cardiovascular: Normal rate, regular rhythm, normal heart sounds and intact distal pulses.  Pulses:      Dorsalis pedis pulses are 2+ on the right side and 2+ on the left side.       Posterior tibial pulses are 2+ on the right side and 2+ on the left side.  Pulmonary/Chest: Effort normal and breath sounds normal. No accessory muscle usage. No respiratory distress.  Abdominal: Soft. Bowel sounds are  normal.  Musculoskeletal: Normal range of motion.  Neurological: She is alert and oriented to person, place, and time.  Skin: Skin is warm and dry.  Vitals reviewed.  Radiology: No results found.  Laboratory examination:   PCP labs 01/02/2019: Glucose 247, creatinine 0.9, EGFR 77, potassium 4.1, CMP normal.  Vitamin D low at 27.9.  Hemoglobin A1c 11.6%.  CMP Latest Ref Rng & Units 12/29/2015 10/14/2012 02/16/2009  Glucose 65 - 99 mg/dL 260(H) - 190(H)  BUN 6 - 20 mg/dL '14 11 7  ' Creatinine 0.44 - 1.00 mg/dL 0.91 0.9 0.87  Sodium 135 - 145 mmol/L 140 139  134(L)  Potassium 3.5 - 5.1 mmol/L 3.3(L) 3.7 3.0(L)  Chloride 101 - 111 mmol/L 99(L) - 102  CO2 22 - 32 mmol/L 29 - 22  Calcium 8.9 - 10.3 mg/dL 9.9 - 8.6  Total Protein 6.0 - 8.3 g/dL - - 7.1  Total Bilirubin 0.3 - 1.2 mg/dL - - 0.7  Alkaline Phos 25 - 125 U/L - 67 52  AST 13 - 35 U/L - 13 53(H)  ALT 7 - 35 U/L - 19 56(H)   CBC Latest Ref Rng & Units 12/29/2015 02/15/2009  WBC 4.0 - 10.5 K/uL 6.4 8.1  Hemoglobin 12.0 - 15.0 g/dL 16.2(H) 14.8  Hematocrit 36.0 - 46.0 % 48.0(H) 43.3  Platelets 150 - 400 K/uL 290 254   Lipid Panel  No results found for: CHOL, TRIG, HDL, CHOLHDL, VLDL, LDLCALC, LDLDIRECT HEMOGLOBIN A1C Lab Results  Component Value Date   HGBA1C 8.2 (A) 10/14/2012   TSH No results for input(s): TSH in the last 8760 hours.  Cardiac Studies:     Assessment   Abnormal EKG - Plan: EKG 12-Lead, PCV ECHOCARDIOGRAM COMPLETE, PCV CARDIAC STRESS TEST  Palpitations  Uncontrolled type 2 diabetes mellitus, with long-term current use of insulin (HCC)  Essential hypertension  PCP EKG 02/06/2018: Normal sinus rhythm at 89 bpm, left axis deviation, poor R wave progression cannot exclude anterior infarct old. EKG 04/20/2019: Normal sinus rhythm at 92 bpm, left axis deviation, poor R wave progression, cannot exclude anterior infarct old. No evidence of ischemia. Abnormal EKG.   Recommendations:   Patient referred to [place]  on an urgent basis for abnormal EKG.  No acute abnormalities noted by EKG.  She has not had any episodes of chest pain, shortness of breath, diaphoresis.  Appears to be fairly active without any exertional difficulties.  In view of her risk factors and EKG findings, I recommended further evaluation with echocardiogram to exclude any structural abnormalities.  Would also recommend routine treadmill stress testing for further re-stratification given diabetes and hypertension.  She is on appropriate medical therapy, I do not have recent lipids and will request from PCP office for further stratification.  In regard to palpitations, has occasional brief episodes of heart racing lasting for just a few seconds associated with emotional upset.  Do not feel that she needs monitoring at this time.  Advised patient and her sister to notify me if symptoms worsen and will consider monitor at that point.  No changes were made medications today.  I discussed aggressive control of risk factors and continued diet and lifestyle modifications.  I will see him back after the test for further recommendations and reevaluation.   *I have discussed this case with Dr. Woody Seller and he participated in formulating the plan.*   Miquel Dunn, MSN, APRN, FNP-C [facility]. [place] Office: [phone] Fax: [phone]

## 2019-04-27 ENCOUNTER — Ambulatory Visit (INDEPENDENT_AMBULATORY_CARE_PROVIDER_SITE_OTHER): Payer: Medicare Other

## 2019-04-27 ENCOUNTER — Other Ambulatory Visit: Payer: Self-pay

## 2019-04-27 DIAGNOSIS — R9431 Abnormal electrocardiogram [ECG] [EKG]: Secondary | ICD-10-CM

## 2019-04-27 NOTE — Progress Notes (Deleted)
Exercise treadmill stress test 04/27/2019:   Indication: Abnormal EKG, palpitations  The patient exercised on Bruce protocol for: 59 minutes.  Patient achieved 4.64 METs and reached heart rate 171 bpm, which is 109% of maximum age-predicted heart rate.  Stress test terminated due to fatigue.  Exercise capacity was below average for age. Heart rate response to exercise: Appropriate Blood pressure response to exercise: Resting hypertension 150/91 mmHg-exaggerated response with peak exercise blood pressure 220/100 mmHg. Chest pain: None.  Arrhythmias: None. Resting EKG demonstrates normal sinus rhythm, left axis deviation. ST changes: With peak exercise there was no ST-T changes of ischemia.  Overall impression: Normal stress test with low exercise capacity. Recommendations: Continue primary/secondary prevention, especially blood pressure control.

## 2019-04-29 ENCOUNTER — Telehealth: Payer: Self-pay | Admitting: Cardiology

## 2019-04-29 NOTE — Telephone Encounter (Signed)
Discussed stress test results with patients sister. I have recommended better blood pressure control with addition of blood pressure medication; however, patients sister wishes to discuss with Dr. August Saucer prior to this. We will proceed with echo and follow up appt afterwards and further discuss.

## 2019-05-21 ENCOUNTER — Other Ambulatory Visit: Payer: Medicare Other

## 2019-05-29 ENCOUNTER — Ambulatory Visit: Payer: Medicare Other | Admitting: Cardiology

## 2019-06-15 ENCOUNTER — Other Ambulatory Visit: Payer: Self-pay

## 2019-06-15 ENCOUNTER — Ambulatory Visit (INDEPENDENT_AMBULATORY_CARE_PROVIDER_SITE_OTHER): Payer: Medicare Other

## 2019-06-15 DIAGNOSIS — R9431 Abnormal electrocardiogram [ECG] [EKG]: Secondary | ICD-10-CM | POA: Diagnosis not present

## 2019-06-22 NOTE — Progress Notes (Signed)
Primary Physician:  Rogers Blocker, MD   Patient ID: [name], female    DOB: 02/27/1956, 63 y.o.   MRN: [id number]  Subjective:    Chief Complaint  Patient presents with  . Palpitations  . Diabetes  . Hypertension  . Follow-up    after testing   This visit type was conducted due to national recommendations for restrictions regarding the COVID-19 Pandemic (e.g. social distancing).  This format is felt to be most appropriate for this patient at this time.  All issues noted in this document were discussed and addressed.  No physical exam was performed (except for noted visual exam findings with Telehealth visits).  The patient has consented to conduct a Telehealth visit and understands insurance will be billed.   I discussed the limitations of evaluation and management by telemedicine and the availability of in person appointments. The patient expressed understanding and agreed to proceed.  Virtual Visit via Video Note is as below  I connected with patients sister, [name], who is her caregiver, on 06/23/19 at [phone] by telephone and verified that I am speaking with the correct person using two identifiers. Unable to perform video visit as patient did not have equipment. Patient is poor historian; therefore, visit was performed with her sister.    I have discussed with the patient regarding the safety during COVID Pandemic and steps and precautions including social distancing with the patient.    HPI: [name]  is a 63 y.o. female  with hypertension, uncontrolled diabetes, vitamin D deficiency, evaluated by [place] for abnormal EKG.   Except for palpitations that occur when patient gets excited or anxious and describes as racing lasting for a few seconds, patient is asymptomatic. Monitor was not placed in view of brief episodes of palpitations. She underwent treadmill stress test on 05/04/19 that was without EKG changes, but did note uncontrolled hypertension. I had discussed  results with patients sister over the phone and recommended addition of BP medication; however, she wanted to discuss with PCP first. She underwent echocardiogram and now presents to discuss results.   She has not seen her PCP yet to discuss BP management and did not have a way to check BP today due to misplacing with renovations to their home. Sister reports that patient denies any chest pain, dyspnea on exertion, leg edema, PND, orthopnea, or symptoms suggestive of TIA or claudication. Does have right hip pain found to be related to arthritis.   Patient is mostly sedentary, but does do some activities around the house and does do some walking around the house and up and down the driveway. Sister denies any difficulty with doing this.   Denies any former tobacco use. Has family history positive for heart disease in her mother at age 54.   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   Past Medical History:  Diagnosis Date  . Diabetes mellitus   . Hyperlipidemia   . Hypertension   . Obesity, unspecified   . Reflux esophagitis   . Unspecified vitamin D deficiency     Past Surgical History:  Procedure Laterality Date  . CHOLECYSTECTOMY      Social History   Socioeconomic History  . Marital status: Single    Spouse name: Not on file  . Number of children: 0  . Years of education: Not on file  . Highest education level: Not on file  Occupational History  . Not on file  Social Needs  .  Financial resource strain: Not on file  . Food insecurity    Worry: Not on file    Inability: Not on file  . Transportation needs    Medical: Not on file    Non-medical: Not on file  Tobacco Use  . Smoking status: Never Smoker  . Smokeless tobacco: Never Used  Substance and Sexual Activity  . Alcohol use: No  . Drug use: No  . Sexual activity: Not on file  Lifestyle  . Physical activity    Days per week: Not on file    Minutes per session: Not on file  .  Stress: Not on file  Relationships  . Social Herbalist on phone: Not on file    Gets together: Not on file    Attends religious service: Not on file    Active member of club or organization: Not on file    Attends meetings of clubs or organizations: Not on file    Relationship status: Not on file  . Intimate partner violence    Fear of current or ex partner: Not on file    Emotionally abused: Not on file    Physically abused: Not on file    Forced sexual activity: Not on file  Other Topics Concern  . Not on file  Social History Narrative  . Not on file    Review of Systems  Constitution: Negative for decreased appetite, malaise/fatigue, weight gain and weight loss.  Eyes: Negative for visual disturbance.  Cardiovascular: Positive for palpitations (with getting excited). Negative for chest pain, claudication, dyspnea on exertion, leg swelling, orthopnea and syncope.  Respiratory: Negative for hemoptysis and wheezing.   Endocrine: Negative for cold intolerance and heat intolerance.  Hematologic/Lymphatic: Does not bruise/bleed easily.  Skin: Negative for nail changes.  Musculoskeletal: Positive for arthritis. Negative for muscle weakness and myalgias.  Gastrointestinal: Negative for abdominal pain, change in bowel habit, nausea and vomiting.  Neurological: Negative for difficulty with concentration, dizziness, focal weakness and headaches.  Psychiatric/Behavioral: Negative for altered mental status and suicidal ideas.  All other systems reviewed and are negative.     Objective:  Height _0  (1.6 m), weight 244 lb (110.7 kg). Body mass index is 43.22 kg/m.   Physical exam not performed or limited due to virtual visit.    Please see exam details from prior visit is as below.    Physical Exam  Constitutional: She is oriented to person, place, and time. Vital signs are normal. She appears well-developed and well-nourished.  HENT:  Head: Normocephalic and  atraumatic.  Neck: Normal range of motion.  Cardiovascular: Normal rate, regular rhythm, normal heart sounds and intact distal pulses.  Pulses:      Dorsalis pedis pulses are 2+ on the right side and 2+ on the left side.       Posterior tibial pulses are 2+ on the right side and 2+ on the left side.  Pulmonary/Chest: Effort normal and breath sounds normal. No accessory muscle usage. No respiratory distress.  Abdominal: Soft. Bowel sounds are normal.  Musculoskeletal: Normal range of motion.  Neurological: She is alert and oriented to person, place, and time.  Skin: Skin is warm and dry.  Vitals reviewed.  Radiology: No results found.  Laboratory examination:   PCP labs 01/02/2019: Glucose 247, creatinine 0.9, EGFR 77, potassium 4.1, CMP normal.  Vitamin D low at 27.9.  Hemoglobin A1c 11.6%.  CMP Latest Ref Rng & Units 12/29/2015 10/14/2012 02/16/2009  Glucose 65 -  99 mg/dL 260(H) - 190(H)  BUN 6 - 20 mg/dL _0 Creatinine 0.44 - 1.00 mg/dL 0.91 0.9 0.87  Sodium 135 - 145 mmol/L 140 139 134(L)  Potassium 3.5 - 5.1 mmol/L 3.3(L) 3.7 3.0(L)  Chloride 101 - 111 mmol/L 99(L) - 102  CO2 22 - 32 mmol/L 29 - 22  Calcium 8.9 - 10.3 mg/dL 9.9 - 8.6  Total Protein 6.0 - 8.3 g/dL - - 7.1  Total Bilirubin 0.3 - 1.2 mg/dL - - 0.7  Alkaline Phos 25 - 125 U/L - 67 52  AST 13 - 35 U/L - 13 53(H)  ALT 7 - 35 U/L - 19 56(H)   CBC Latest Ref Rng & Units 12/29/2015 02/15/2009  WBC 4.0 - 10.5 K/uL 6.4 8.1  Hemoglobin 12.0 - 15.0 g/dL 16.2(H) 14.8  Hematocrit 36.0 - 46.0 % 48.0(H) 43.3  Platelets 150 - 400 K/uL 290 254   Lipid Panel  No results found for: CHOL, TRIG, HDL, CHOLHDL, VLDL, LDLCALC, LDLDIRECT HEMOGLOBIN A1C Lab Results  Component Value Date   HGBA1C 8.2 (A) 10/14/2012   TSH No results for input(s): TSH in the last 8760 hours.  PRN Meds:. Medications Discontinued During This Encounter  Medication Reason  . amoxicillin (AMOXIL) 875 MG tablet Error  . cephALEXin (KEFLEX) 500 MG  capsule Error  . glimepiride (AMARYL) 2 MG tablet Error  . omeprazole (PRILOSEC) 20 MG capsule Error   Current Meds  Medication Sig  . BD PEN NEEDLE NANO U/F 32G X 4 MM MISC USE 1  BID UTD  . benazepril (LOTENSIN) 20 MG tablet Take 1 tablet by mouth daily.  . chlorthalidone (HYGROTON) 25 MG tablet TK 1 T PO D  . cholecalciferol (VITAMIN D) 1000 units tablet Take 1,000 Units by mouth daily.  Marland Kitchen ibuprofen (ADVIL,MOTRIN) 200 MG tablet Take 400 mg by mouth every 6 (six) hours as needed for fever, headache, mild pain, moderate pain or cramping.  Marland Kitchen LEVEMIR FLEXTOUCH 100 UNIT/ML Pen Inject 15-19 Units as directed 2 (two) times a day.  . metFORMIN (GLUCOPHAGE) 1000 MG tablet Take 1,000 mg by mouth 2 (two) times daily with a meal.   . Potassium Chloride ER 20 MEQ TBCR Take 20 mEq by mouth daily.  . rosuvastatin (CRESTOR) 5 MG tablet Take 5 mg by mouth daily.  Marland Kitchen triamterene-hydrochlorothiazide (MAXZIDE-25) 37.5-25 MG per tablet Take 1 tablet by mouth daily.    Cardiac Studies:   Exercise treadmill stress test 04/27/2019:   Indication: Abnormal EKG, palpitations  The patient exercised on Bruce protocol for: 59 minutes.  Patient achieved 4.64 METs and reached heart rate 171 bpm, which is 109% of maximum age-predicted heart rate.  Stress test terminated due to fatigue.  Exercise capacity was below average for age. Heart rate response to exercise: Appropriate Blood pressure response to exercise: Resting hypertension 150/91 mmHg-exaggerated response with peak exercise blood pressure 220/100 mmHg. Chest pain: None.  Arrhythmias: None. Resting EKG demonstrates normal sinus rhythm, left axis deviation. ST changes: With peak exercise there was no ST-T changes of ischemia.  Overall impression: Normal stress test with low exercise capacity. Recommendations: Continue primary/secondary prevention, especially blood pressure control.  Echocardiogram 06/15/2019: Hyperdynamic LV systolic function with  visual EF 65-70%. Patient is mildly tachycardic throughout the study.  Left ventricle cavity is normal in size. Moderate concentric hypertrophy of the left ventricle. Normal global wall motion. Doppler evidence of grade I (impaired) diastolic dysfunction, normal LAP.  Mild tricuspid regurgitation. Estimated pulmonary artery systolic pressure  is 29 mmHg.  Assessment:     ICD-10-CM   1. Essential hypertension  I10   2. Tachycardia  R00.0   3. Uncontrolled type 2 diabetes mellitus, with long-term current use of insulin (HCC)  E11.65    Z79.4   4. Abnormal EKG  R94.31     PCP EKG 02/06/2018: Normal sinus rhythm at 89 bpm, left axis deviation, poor R wave progression cannot exclude anterior infarct old.   EKG 04/20/2019: Normal sinus rhythm at 92 bpm, left axis deviation, poor R wave progression, cannot exclude anterior infarct old. No evidence of ischemia. Abnormal EKG.   Recommendations:   Patient's stress test and echocardiogram results were discussed with the patient sister.  Patient was noted to have low exercise capacity with exaggerated blood pressure response and resting hypertension during her stress test.  Fortunately was without EKG changes suggestive of ischemia.  She is noted to have some blood pressure changes to her echocardiogram with grade 1 diastolic dysfunction and moderate LVH.  Diastolic dysfunction also likely related to diabetes and her age.  She does have hyperdynamic LV with LVEF of 65 to 70% and was noted to be tachycardic through her study.  She was also borderline tachycardic on EKG in our office.  I will start her on verapamil 120 mg daily for hypertension, hyperdynamic LV, and tachycardia.  We will also send in prescription for a blood pressure cuff so that they can begin home monitoring of both her blood pressure and her heart rate.  Advised him to keep a diary to bring to the next office visit.    She does have occasional palpitations that last for a few seconds.  Do  not feel that she needs monitoring at this time as they are very brief episodes and likely sinus tachycardia related to low exercise capacity.  Will recommend starting regular exercise program once blood pressure is well controlled to improve exercise capacity.  She will continue to work with PCP for diabetes management.  She is also on Crestor for management of hyperlipidemia that is being managed by her PCP.  Patient's sister is also in close contact with her daughter who works as a Tourist information centre manager at [employer].  I have advised her that she may contact me if she would like to further discuss.  I will see her back in 2 to 3 weeks to follow-up on her blood pressure, but encouraged sooner follow-up if needed for any concerns.  Miquel Dunn, MSN, APRN, FNP-C [facility]. [place] Office: [phone] Fax: [phone]

## 2019-06-23 ENCOUNTER — Other Ambulatory Visit: Payer: Self-pay

## 2019-06-23 ENCOUNTER — Encounter: Payer: Self-pay | Admitting: Cardiology

## 2019-06-23 ENCOUNTER — Ambulatory Visit (INDEPENDENT_AMBULATORY_CARE_PROVIDER_SITE_OTHER): Payer: Medicare Other | Admitting: Cardiology

## 2019-06-23 ENCOUNTER — Other Ambulatory Visit: Payer: Self-pay | Admitting: Cardiology

## 2019-06-23 VITALS — Ht 63.0 in | Wt 244.0 lb

## 2019-06-23 DIAGNOSIS — R9431 Abnormal electrocardiogram [ECG] [EKG]: Secondary | ICD-10-CM

## 2019-06-23 DIAGNOSIS — I10 Essential (primary) hypertension: Secondary | ICD-10-CM

## 2019-06-23 DIAGNOSIS — E1165 Type 2 diabetes mellitus with hyperglycemia: Secondary | ICD-10-CM

## 2019-06-23 DIAGNOSIS — R000 Tachycardia, unspecified: Secondary | ICD-10-CM | POA: Diagnosis not present

## 2019-06-23 DIAGNOSIS — IMO0002 Reserved for concepts with insufficient information to code with codable children: Secondary | ICD-10-CM

## 2019-06-23 DIAGNOSIS — Z794 Long term (current) use of insulin: Secondary | ICD-10-CM

## 2019-06-23 MED ORDER — VERAPAMIL HCL ER 120 MG PO TBCR: 120.0000 mg | EXTENDED_RELEASE_TABLET | Freq: Every day | ORAL | 1 refills | Status: DC

## 2019-06-23 MED ORDER — ADULT BLOOD PRESSURE CUFF LG KIT: 1.0000 | PACK | Freq: Once | 0 refills | Status: AC

## 2019-07-16 ENCOUNTER — Telehealth: Payer: Self-pay

## 2019-07-16 NOTE — Telephone Encounter (Signed)
Telephone encounter:  Reason for call: Chest pain last night not anymore; Concerned because she just started verapamil; She didn't take anything to make the cp go away it just went away over time   Usual provider: AK  Last office visit: 06/23/2019  Next office visit: 07/22/2019   Last hospitalization: 12/29/2015   Current Outpatient Medications on File Prior to Visit  Medication Sig Dispense Refill  . BD PEN NEEDLE NANO U/F 32G X 4 MM MISC USE 1  BID UTD    . benazepril (LOTENSIN) 20 MG tablet Take 1 tablet by mouth daily.    . chlorthalidone (HYGROTON) 25 MG tablet TK 1 T PO D    . cholecalciferol (VITAMIN D) 1000 units tablet Take 1,000 Units by mouth daily.    Marland Kitchen ibuprofen (ADVIL,MOTRIN) 200 MG tablet Take 400 mg by mouth every 6 (six) hours as needed for fever, headache, mild pain, moderate pain or cramping.    Marland Kitchen LEVEMIR FLEXTOUCH 100 UNIT/ML Pen Inject 15-19 Units as directed 2 (two) times a day.    . metFORMIN (GLUCOPHAGE) 1000 MG tablet Take 1,000 mg by mouth 2 (two) times daily with a meal.     . Potassium Chloride ER 20 MEQ TBCR Take 20 mEq by mouth daily.    . rosuvastatin (CRESTOR) 5 MG tablet Take 5 mg by mouth daily.    Marland Kitchen triamterene-hydrochlorothiazide (MAXZIDE-25) 37.5-25 MG per tablet Take 1 tablet by mouth daily.    . verapamil (CALAN-SR) 120 MG CR tablet TAKE 1 TABLET(120 MG) BY MOUTH DAILY 90 tablet 0   No current facility-administered medications on file prior to visit.

## 2019-07-16 NOTE — Telephone Encounter (Signed)
If recurrent chest pain, should go to the ED. If SBP <170 mmhg, okay to hold verapamil. Discuss with AK at upcoming office visit.  Thanks MJP

## 2019-07-20 ENCOUNTER — Telehealth: Payer: Self-pay

## 2019-07-20 NOTE — Telephone Encounter (Signed)
Pt is still having recurrent cp with the start of verapamil she comes in Wednesday

## 2019-07-21 NOTE — Telephone Encounter (Signed)
Lvm with details

## 2019-07-21 NOTE — Telephone Encounter (Signed)
No she is still taking it

## 2019-07-21 NOTE — Telephone Encounter (Signed)
Have her stop it

## 2019-07-22 ENCOUNTER — Ambulatory Visit (INDEPENDENT_AMBULATORY_CARE_PROVIDER_SITE_OTHER): Payer: Medicare Other | Admitting: Cardiology

## 2019-07-22 ENCOUNTER — Other Ambulatory Visit: Payer: Self-pay | Admitting: Cardiology

## 2019-07-22 ENCOUNTER — Other Ambulatory Visit: Payer: Self-pay

## 2019-07-22 ENCOUNTER — Encounter: Payer: Self-pay | Admitting: Cardiology

## 2019-07-22 VITALS — BP 115/68 | HR 85 | Ht 63.0 in | Wt 189.9 lb

## 2019-07-22 DIAGNOSIS — I10 Essential (primary) hypertension: Secondary | ICD-10-CM | POA: Diagnosis not present

## 2019-07-22 DIAGNOSIS — E7800 Pure hypercholesterolemia, unspecified: Secondary | ICD-10-CM

## 2019-07-22 DIAGNOSIS — R0789 Other chest pain: Secondary | ICD-10-CM

## 2019-07-22 DIAGNOSIS — R000 Tachycardia, unspecified: Secondary | ICD-10-CM

## 2019-07-22 DIAGNOSIS — E119 Type 2 diabetes mellitus without complications: Secondary | ICD-10-CM

## 2019-07-22 DIAGNOSIS — E785 Hyperlipidemia, unspecified: Secondary | ICD-10-CM

## 2019-07-22 HISTORY — DX: Hyperlipidemia, unspecified: E78.5

## 2019-07-22 HISTORY — DX: Essential (primary) hypertension: I10

## 2019-07-22 HISTORY — DX: Type 2 diabetes mellitus without complications: E11.9

## 2019-07-22 MED ORDER — METOPROLOL SUCCINATE ER 25 MG PO TB24: 25.0000 mg | ORAL_TABLET | Freq: Every day | ORAL | 1 refills | Status: DC

## 2019-07-22 NOTE — Progress Notes (Signed)
Primary Physician:  Rogers Blocker, MD   Patient ID: [name], female    DOB: 1956/02/11, 63 y.o.   MRN: [id number]  Subjective:    Chief Complaint  Patient presents with  . Hypertension  . Chest Pain    pressure   . Follow-up     HPI: [name]  is a 63 y.o. female  with hypertension, uncontrolled diabetes, vitamin D deficiency, evaluated by [place] for abnormal EKG.   Except for palpitations that occur when patient gets excited or anxious and describes as racing lasting for a few seconds, patient is asymptomatic. Monitor was not placed in view of brief episodes of palpitations. She underwent treadmill stress test on 05/04/19 that was without EKG changes, but did note uncontrolled hypertension.  She underwent echocardiogram on 06/15/2019 revealing hyperdynamic LV with normal LVEF.  She was noted to be tachycardic throughout the study.  She was started on verapamil 120 mg dose for hyperdynamic LV and tachycardia. Sister reports that a few weeks ago, after patient carried in a heavy container of water, she began complaining of chest pressure. Pain can be present at any time. No pain radiation or associated shortness of breath. She does also have some discomfort with walking particularly uphill. Improves with resting.  Sister reports that patient denies any leg edema, PND, orthopnea, or symptoms suggestive of TIA or claudication. Does have right hip pain found to be related to arthritis. She does have dyspnea on exertion. No recent complaints of palpitations.  Patient is mostly sedentary, but does do some activities around the house.  She has been walking their dog for 20 mins around their yard daily.   Denies any former tobacco use. Has family history positive for heart disease in her mother at age 43.   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   Past Medical History:  Diagnosis Date  . Diabetes mellitus   . DM (diabetes mellitus) ([place]) 07/22/2019  .  HLD (hyperlipidemia) 07/22/2019  . HTN (hypertension) 07/22/2019  . Hyperlipidemia   . Hypertension   . Obesity, unspecified   . Reflux esophagitis   . Type 2 diabetes mellitus without complication, without long-term current use of insulin ([place]) 07/22/2019  . Unspecified vitamin D deficiency     Past Surgical History:  Procedure Laterality Date  . CHOLECYSTECTOMY      Social History   Socioeconomic History  . Marital status: Widowed    Spouse name: Not on file  . Number of children: 0  . Years of education: Not on file  . Highest education level: Not on file  Occupational History  . Not on file  Social Needs  . Financial resource strain: Not on file  . Food insecurity    Worry: Not on file    Inability: Not on file  . Transportation needs    Medical: Not on file    Non-medical: Not on file  Tobacco Use  . Smoking status: Never Smoker  . Smokeless tobacco: Never Used  Substance and Sexual Activity  . Alcohol use: No  . Drug use: No  . Sexual activity: Not on file  Lifestyle  . Physical activity    Days per week: Not on file    Minutes per session: Not on file  . Stress: Not on file  Relationships  . Social Herbalist on phone: Not on file    Gets together: Not on file    Attends religious  service: Not on file    Active member of club or organization: Not on file    Attends meetings of clubs or organizations: Not on file    Relationship status: Not on file  . Intimate partner violence    Fear of current or ex partner: Not on file    Emotionally abused: Not on file    Physically abused: Not on file    Forced sexual activity: Not on file  Other Topics Concern  . Not on file  Social History Narrative  . Not on file    Review of Systems  Constitution: Negative for decreased appetite, malaise/fatigue, weight gain and weight loss.  Eyes: Negative for visual disturbance.  Cardiovascular: Positive for chest pain, dyspnea on exertion and palpitations (with  getting excited). Negative for claudication, leg swelling, orthopnea and syncope.  Respiratory: Negative for hemoptysis and wheezing.   Endocrine: Negative for cold intolerance and heat intolerance.  Hematologic/Lymphatic: Does not bruise/bleed easily.  Skin: Negative for nail changes.  Musculoskeletal: Positive for arthritis. Negative for muscle weakness and myalgias.  Gastrointestinal: Negative for abdominal pain, change in bowel habit, nausea and vomiting.  Neurological: Negative for difficulty with concentration, dizziness, focal weakness and headaches.  Psychiatric/Behavioral: Negative for altered mental status and suicidal ideas.  All other systems reviewed and are negative.     Objective:  Blood pressure 115/68, pulse 85, height _0  (1.6 m), weight 189 lb 14.4 oz (86.1 kg), SpO2 97 %. Body mass index is 33.64 kg/m.     Physical Exam  Constitutional: She is oriented to person, place, and time. Vital signs are normal. She appears well-developed and well-nourished.  HENT:  Head: Normocephalic and atraumatic.  Neck: Normal range of motion.  Cardiovascular: Normal rate, regular rhythm, normal heart sounds and intact distal pulses.  Pulses:      Dorsalis pedis pulses are 2+ on the right side and 2+ on the left side.       Posterior tibial pulses are 2+ on the right side and 2+ on the left side.  Pulmonary/Chest: Effort normal and breath sounds normal. No accessory muscle usage. No respiratory distress.  Abdominal: Soft. Bowel sounds are normal.  Musculoskeletal: Normal range of motion.  Neurological: She is alert and oriented to person, place, and time.  Skin: Skin is warm and dry.  Vitals reviewed.  Radiology: No results found.  Laboratory examination:   PCP labs 01/02/2019: Glucose 247, creatinine 0.9, EGFR 77, potassium 4.1, CMP normal.  Vitamin D low at 27.9.  Hemoglobin A1c 11.6%.  CMP Latest Ref Rng & Units 12/29/2015 10/14/2012 02/16/2009  Glucose 65 - 99 mg/dL 260(H) -  190(H)  BUN 6 - 20 mg/dL _1 Creatinine 0.44 - 1.00 mg/dL 0.91 0.9 0.87  Sodium 135 - 145 mmol/L 140 139 134(L)  Potassium 3.5 - 5.1 mmol/L 3.3(L) 3.7 3.0(L)  Chloride 101 - 111 mmol/L 99(L) - 102  CO2 22 - 32 mmol/L 29 - 22  Calcium 8.9 - 10.3 mg/dL 9.9 - 8.6  Total Protein 6.0 - 8.3 g/dL - - 7.1  Total Bilirubin 0.3 - 1.2 mg/dL - - 0.7  Alkaline Phos 25 - 125 U/L - 67 52  AST 13 - 35 U/L - 13 53(H)  ALT 7 - 35 U/L - 19 56(H)   CBC Latest Ref Rng & Units 12/29/2015 02/15/2009  WBC 4.0 - 10.5 K/uL 6.4 8.1  Hemoglobin 12.0 - 15.0 g/dL 16.2(H) 14.8  Hematocrit 36.0 - 46.0 % 48.0(H) 43.3  Platelets 150 - 400 K/uL 290 254   Lipid Panel  No results found for: CHOL, TRIG, HDL, CHOLHDL, VLDL, LDLCALC, LDLDIRECT HEMOGLOBIN A1C Lab Results  Component Value Date   HGBA1C 8.2 (A) 10/14/2012   TSH No results for input(s): TSH in the last 8760 hours.  PRN Meds:. Medications Discontinued During This Encounter  Medication Reason  . verapamil (CALAN-SR) 120 MG CR tablet Discontinued by provider   Current Meds  Medication Sig  . benazepril (LOTENSIN) 20 MG tablet Take 1 tablet by mouth daily.  . chlorthalidone (HYGROTON) 25 MG tablet TK 1 T PO D  . cholecalciferol (VITAMIN D) 1000 units tablet Take 1,000 Units by mouth daily.  Marland Kitchen ibuprofen (ADVIL,MOTRIN) 200 MG tablet Take 400 mg by mouth every 6 (six) hours as needed for fever, headache, mild pain, moderate pain or cramping.  Marland Kitchen LEVEMIR FLEXTOUCH 100 UNIT/ML Pen Inject 20 Units as directed 2 (two) times a day.   . metFORMIN (GLUCOPHAGE) 1000 MG tablet Take 1,000 mg by mouth 2 (two) times daily with a meal.   . Potassium Chloride ER 20 MEQ TBCR Take 20 mEq by mouth daily.  . rosuvastatin (CRESTOR) 5 MG tablet Take 5 mg by mouth daily.  Marland Kitchen triamterene-hydrochlorothiazide (MAXZIDE-25) 37.5-25 MG per tablet Take 1 tablet by mouth daily.  . [DISCONTINUED] verapamil (CALAN-SR) 120 MG CR tablet TAKE 1 TABLET(120 MG) BY MOUTH DAILY    Cardiac  Studies:   Exercise treadmill stress test 04/27/2019:   Indication: Abnormal EKG, palpitations  The patient exercised on Bruce protocol for: 59 minutes.  Patient achieved 4.64 METs and reached heart rate 171 bpm, which is 109% of maximum age-predicted heart rate.  Stress test terminated due to fatigue.  Exercise capacity was below average for age. Heart rate response to exercise: Appropriate Blood pressure response to exercise: Resting hypertension 150/91 mmHg-exaggerated response with peak exercise blood pressure 220/100 mmHg. Chest pain: None.  Arrhythmias: None. Resting EKG demonstrates normal sinus rhythm, left axis deviation. ST changes: With peak exercise there was no ST-T changes of ischemia.  Overall impression: Normal stress test with low exercise capacity. Recommendations: Continue primary/secondary prevention, especially blood pressure control.  Echocardiogram 06/15/2019: Hyperdynamic LV systolic function with visual EF 65-70%. Patient is mildly tachycardic throughout the study.  Left ventricle cavity is normal in size. Moderate concentric hypertrophy of the left ventricle. Normal global wall motion. Doppler evidence of grade I (impaired) diastolic dysfunction, normal LAP.  Mild tricuspid regurgitation. Estimated pulmonary artery systolic pressure is 29 mmHg.  Assessment:     ICD-10-CM   1. Musculoskeletal chest pain  R07.89 EKG 12-Lead  2. Essential hypertension  I10   3. Pure hypercholesterolemia  E78.00   4. Type 2 diabetes mellitus without complication, without long-term current use of insulin ([facility])  E11.9   5. Tachycardia  R00.0    EKG 07/22/2019: sinus tachycardia at 100 bpm, left axis deviation, PRWP cannot exclude anterior infarct old. No evidence of ischemia.   Recommendations:   Patients chest pain is clearly musculoskeletal as she has tenderness to her chest wall with palpation. I have recommended heat/ ice to the site and to also use ibuprofen/tylenol as  needed. She does have some chest discomfort and dyspnea on exertion, likely related to hyperdynamic LV and hypertension. She had low risk treadmill stress test. Heart rate continues to be elevated on Verapamil. I will change verapamil to metoprolol succinate 25 mg daily. I have asked her to increase her activity up to 20 minutes twice a  day to see if her symptoms will improve as I suspect deconditioning is also contributing. Had low exercise capacity on stress test. Lipids and diabetes are well controlled. I will see her back in 4 weeks for follow up on chest pain and tachycardia.  Miquel Dunn, MSN, APRN, FNP-C [facility]. [place] Office: [phone] Fax: [phone]

## 2019-07-26 ENCOUNTER — Encounter: Payer: Self-pay | Admitting: Cardiology

## 2019-08-19 ENCOUNTER — Encounter: Payer: Self-pay | Admitting: Cardiology

## 2019-08-19 ENCOUNTER — Ambulatory Visit (INDEPENDENT_AMBULATORY_CARE_PROVIDER_SITE_OTHER): Payer: Medicare Other | Admitting: Cardiology

## 2019-08-19 ENCOUNTER — Other Ambulatory Visit: Payer: Self-pay

## 2019-08-19 VITALS — BP 110/68 | HR 92 | Temp 96.7°F | Ht 63.0 in | Wt 189.2 lb

## 2019-08-19 DIAGNOSIS — I10 Essential (primary) hypertension: Secondary | ICD-10-CM

## 2019-08-19 DIAGNOSIS — R0789 Other chest pain: Secondary | ICD-10-CM

## 2019-08-19 DIAGNOSIS — R000 Tachycardia, unspecified: Secondary | ICD-10-CM | POA: Diagnosis not present

## 2019-08-19 MED ORDER — METOPROLOL SUCCINATE ER 25 MG PO TB24: ORAL_TABLET | ORAL | 3 refills | Status: DC

## 2019-08-19 NOTE — Progress Notes (Signed)
Primary Physician:  Rogers Blocker, MD   Patient ID: [name], female    DOB: 1956/03/18, 63 y.o.   MRN: [id number]  Subjective:    Chief Complaint  Patient presents with  . Tachycardia    4 week f/u  . Chest Pain  . Follow-up     HPI: [name]  is a 63 y.o. female  with hypertension, uncontrolled diabetes, vitamin D deficiency, evaluated by [place] for abnormal EKG.   Except for palpitations that occur when patient gets excited or anxious and describes as racing lasting for a few seconds, patient is asymptomatic. Monitor was not placed in view of brief episodes of palpitations. She underwent treadmill stress test on 05/04/19 that was without EKG changes, but did note uncontrolled hypertension.  She underwent echocardiogram on 06/15/2019 revealing hyperdynamic LV with normal LVEF.  She was noted to be tachycardic throughout the study. Due to persistent tachycardia, verapamil was changed to metoprolol.  She now presents for follow-up.  At her last office visit, she had complained of chest pain thought to be musculoskeletal etiology; however, she did have some exertional chest tightness.  Symptoms have significantly improved, she denies any chest tightness. Does occasionally have mild discomfort with carrying heavy objects, but is able to walk daily without any exertional discomfort. Tolerating medications well. Blood pressure and heart rate have been well controlled. Sister reports that patient denies any leg edema, PND, orthopnea, or symptoms suggestive of TIA or claudication. Does have right hip pain found to be related to arthritis. She does have dyspnea on exertion. No recent complaints of palpitations.  Patient is mostly sedentary, but does do some activities around the house.  She has been walking their dog for 20 mins around their yard daily.   Denies any former tobacco use. Has family history positive for heart disease in her mother at age 21.   Patient has mental impairment  disorder and is cared for by her sister.   Past Medical History:  Diagnosis Date  . Diabetes mellitus   . DM (diabetes mellitus) ([place]) 07/22/2019  . HLD (hyperlipidemia) 07/22/2019  . HTN (hypertension) 07/22/2019  . Hyperlipidemia   . Hypertension   . Obesity, unspecified   . Reflux esophagitis   . Type 2 diabetes mellitus without complication, without long-term current use of insulin ([place]) 07/22/2019  . Unspecified vitamin D deficiency     Past Surgical History:  Procedure Laterality Date  . CHOLECYSTECTOMY      Social History   Socioeconomic History  . Marital status: Widowed    Spouse name: Not on file  . Number of children: 0  . Years of education: Not on file  . Highest education level: Not on file  Occupational History  . Not on file  Social Needs  . Financial resource strain: Not on file  . Food insecurity    Worry: Not on file    Inability: Not on file  . Transportation needs    Medical: Not on file    Non-medical: Not on file  Tobacco Use  . Smoking status: Never Smoker  . Smokeless tobacco: Never Used  Substance and Sexual Activity  . Alcohol use: No  . Drug use: No  . Sexual activity: Not on file  Lifestyle  . Physical activity    Days per week: Not on file    Minutes per session: Not on file  . Stress: Not on file  Relationships  . Social Herbalist on  phone: Not on file    Gets together: Not on file    Attends religious service: Not on file    Active member of club or organization: Not on file    Attends meetings of clubs or organizations: Not on file    Relationship status: Not on file  . Intimate partner violence    Fear of current or ex partner: Not on file    Emotionally abused: Not on file    Physically abused: Not on file    Forced sexual activity: Not on file  Other Topics Concern  . Not on file  Social History Narrative  . Not on file    Review of Systems  Constitution: Negative for decreased appetite, malaise/fatigue,  weight gain and weight loss.  Eyes: Negative for visual disturbance.  Cardiovascular: Positive for chest pain, dyspnea on exertion and palpitations (with getting excited). Negative for claudication, leg swelling, orthopnea and syncope.  Respiratory: Negative for hemoptysis and wheezing.   Endocrine: Negative for cold intolerance and heat intolerance.  Hematologic/Lymphatic: Does not bruise/bleed easily.  Skin: Negative for nail changes.  Musculoskeletal: Positive for arthritis. Negative for muscle weakness and myalgias.  Gastrointestinal: Negative for abdominal pain, change in bowel habit, nausea and vomiting.  Neurological: Negative for difficulty with concentration, dizziness, focal weakness and headaches.  Psychiatric/Behavioral: Negative for altered mental status and suicidal ideas.  All other systems reviewed and are negative.     Objective:  Blood pressure 110/68, pulse 92, temperature (!) 96.7 F (35.9 C), height _0  (1.6 m), weight 189 lb 3.2 oz (85.8 kg), SpO2 97 %. Body mass index is 33.52 kg/m.     Physical Exam  Constitutional: She is oriented to person, place, and time. Vital signs are normal. She appears well-developed and well-nourished.  HENT:  Head: Normocephalic and atraumatic.  Neck: Normal range of motion.  Cardiovascular: Normal rate, regular rhythm, normal heart sounds and intact distal pulses.  Pulses:      Dorsalis pedis pulses are 2+ on the right side and 2+ on the left side.       Posterior tibial pulses are 2+ on the right side and 2+ on the left side.  Pulmonary/Chest: Effort normal and breath sounds normal. No accessory muscle usage. No respiratory distress.  Abdominal: Soft. Bowel sounds are normal.  Musculoskeletal: Normal range of motion.  Neurological: She is alert and oriented to person, place, and time.  Skin: Skin is warm and dry.  Vitals reviewed.  Radiology: No results found.  Laboratory examination:   PCP labs 01/02/2019: Glucose 247,  creatinine 0.9, EGFR 77, potassium 4.1, CMP normal.  Vitamin D low at 27.9.  Hemoglobin A1c 11.6%.  CMP Latest Ref Rng & Units 12/29/2015 10/14/2012 02/16/2009  Glucose 65 - 99 mg/dL 260(H) - 190(H)  BUN 6 - 20 mg/dL _1 Creatinine 0.44 - 1.00 mg/dL 0.91 0.9 0.87  Sodium 135 - 145 mmol/L 140 139 134(L)  Potassium 3.5 - 5.1 mmol/L 3.3(L) 3.7 3.0(L)  Chloride 101 - 111 mmol/L 99(L) - 102  CO2 22 - 32 mmol/L 29 - 22  Calcium 8.9 - 10.3 mg/dL 9.9 - 8.6  Total Protein 6.0 - 8.3 g/dL - - 7.1  Total Bilirubin 0.3 - 1.2 mg/dL - - 0.7  Alkaline Phos 25 - 125 U/L - 67 52  AST 13 - 35 U/L - 13 53(H)  ALT 7 - 35 U/L - 19 56(H)   CBC Latest Ref Rng & Units 12/29/2015 02/15/2009  WBC 4.0 - 10.5 K/uL 6.4 8.1  Hemoglobin 12.0 - 15.0 g/dL 16.2(H) 14.8  Hematocrit 36.0 - 46.0 % 48.0(H) 43.3  Platelets 150 - 400 K/uL 290 254   Lipid Panel  No results found for: CHOL, TRIG, HDL, CHOLHDL, VLDL, LDLCALC, LDLDIRECT HEMOGLOBIN A1C Lab Results  Component Value Date   HGBA1C 8.2 (A) 10/14/2012   TSH No results for input(s): TSH in the last 8760 hours.  PRN Meds:. There are no discontinued medications. Current Meds  Medication Sig  . BD PEN NEEDLE NANO U/F 32G X 4 MM MISC USE 1  BID UTD  . benazepril (LOTENSIN) 20 MG tablet Take 1 tablet by mouth daily.  . chlorthalidone (HYGROTON) 25 MG tablet TK 1 T PO D  . cholecalciferol (VITAMIN D) 1000 units tablet Take 1,000 Units by mouth daily.  Marland Kitchen ibuprofen (ADVIL,MOTRIN) 200 MG tablet Take 400 mg by mouth every 6 (six) hours as needed for fever, headache, mild pain, moderate pain or cramping.  Marland Kitchen LEVEMIR FLEXTOUCH 100 UNIT/ML Pen Inject 20 Units as directed 2 (two) times a day.   . metFORMIN (GLUCOPHAGE) 1000 MG tablet Take 1,000 mg by mouth 2 (two) times daily with a meal.   . metoprolol succinate (TOPROL-XL) 25 MG 24 hr tablet TAKE 1 TABLET(25 MG) BY MOUTH DAILY  . Potassium Chloride ER 20 MEQ TBCR Take 20 mEq by mouth daily.  . rosuvastatin (CRESTOR) 5  MG tablet Take 5 mg by mouth daily.  Marland Kitchen triamterene-hydrochlorothiazide (MAXZIDE-25) 37.5-25 MG per tablet Take 1 tablet by mouth daily.    Cardiac Studies:   Exercise treadmill stress test 04/27/2019:   Indication: Abnormal EKG, palpitations  The patient exercised on Bruce protocol for: 59 minutes.  Patient achieved 4.64 METs and reached heart rate 171 bpm, which is 109% of maximum age-predicted heart rate.  Stress test terminated due to fatigue.  Exercise capacity was below average for age. Heart rate response to exercise: Appropriate Blood pressure response to exercise: Resting hypertension 150/91 mmHg-exaggerated response with peak exercise blood pressure 220/100 mmHg. Chest pain: None.  Arrhythmias: None. Resting EKG demonstrates normal sinus rhythm, left axis deviation. ST changes: With peak exercise there was no ST-T changes of ischemia.  Overall impression: Normal stress test with low exercise capacity. Recommendations: Continue primary/secondary prevention, especially blood pressure control.  Echocardiogram 06/15/2019: Hyperdynamic LV systolic function with visual EF 65-70%. Patient is mildly tachycardic throughout the study.  Left ventricle cavity is normal in size. Moderate concentric hypertrophy of the left ventricle. Normal global wall motion. Doppler evidence of grade I (impaired) diastolic dysfunction, normal LAP.  Mild tricuspid regurgitation. Estimated pulmonary artery systolic pressure is 29 mmHg.  Assessment:     ICD-10-CM   1. Musculoskeletal chest pain  R07.89   2. Tachycardia  R00.0   3. Essential hypertension  I10    EKG 07/22/2019: sinus tachycardia at 100 bpm, left axis deviation, PRWP cannot exclude anterior infarct old. No evidence of ischemia.   Recommendations:   Tachycardia blood pressure significantly improved with addition of metoprolol.  She is tolerating medication well.  She has not had any further exertional chest tightness and  musculoskeletal chest pain has remarkably improved.  She was previously having this persistently, and now only occurs with carrying heavy objects or with certain movements.  Encouraged her to continue to use heat and ice to her chest wall.  Encouraged continued daily exercise to prevent any deconditioning.  She continues to be followed by her PCP for management of diabetes  and other risk factor modification.  I would like to see her back in 6 months for follow-up, and if she remains stable at that time, will consider PRN follow-up.   Miquel Dunn, MSN, APRN, FNP-C [facility]. [place] Office: [phone] Fax: [phone]

## 2019-09-15 ENCOUNTER — Other Ambulatory Visit: Payer: Self-pay

## 2019-09-15 ENCOUNTER — Encounter (HOSPITAL_COMMUNITY): Payer: Self-pay | Admitting: Emergency Medicine

## 2019-09-15 ENCOUNTER — Emergency Department (HOSPITAL_COMMUNITY): Payer: Medicare Other

## 2019-09-15 ENCOUNTER — Emergency Department (HOSPITAL_COMMUNITY)
Admission: EM | Admit: 2019-09-15 | Discharge: 2019-09-15 | Disposition: A | Discharge status: Home / Self Care | Payer: Medicare Other | Source: Home / Self Care | Attending: Emergency Medicine | Admitting: Emergency Medicine

## 2019-09-15 DIAGNOSIS — Z79899 Other long term (current) drug therapy: Secondary | ICD-10-CM | POA: Insufficient documentation

## 2019-09-15 DIAGNOSIS — N200 Calculus of kidney: Secondary | ICD-10-CM | POA: Insufficient documentation

## 2019-09-15 DIAGNOSIS — R1031 Right lower quadrant pain: Secondary | ICD-10-CM | POA: Diagnosis not present

## 2019-09-15 DIAGNOSIS — I10 Essential (primary) hypertension: Secondary | ICD-10-CM | POA: Insufficient documentation

## 2019-09-15 DIAGNOSIS — Z794 Long term (current) use of insulin: Secondary | ICD-10-CM | POA: Insufficient documentation

## 2019-09-15 DIAGNOSIS — N136 Pyonephrosis: Secondary | ICD-10-CM | POA: Diagnosis not present

## 2019-09-15 DIAGNOSIS — E119 Type 2 diabetes mellitus without complications: Secondary | ICD-10-CM | POA: Insufficient documentation

## 2019-09-15 DIAGNOSIS — M549 Dorsalgia, unspecified: Secondary | ICD-10-CM | POA: Insufficient documentation

## 2019-09-15 LAB — CBC
HCT: 42.2 % (ref 36.0–46.0)
Hemoglobin: 13.8 g/dL (ref 12.0–15.0)
MCH: 28.7 pg (ref 26.0–34.0)
MCHC: 32.7 g/dL (ref 30.0–36.0)
MCV: 87.7 fL (ref 80.0–100.0)
Platelets: 266 10*3/uL (ref 150–400)
RBC: 4.81 MIL/uL (ref 3.87–5.11)
RDW: 13 % (ref 11.5–15.5)
WBC: 8.5 10*3/uL (ref 4.0–10.5)
nRBC: 0 % (ref 0.0–0.2)

## 2019-09-15 LAB — COMPREHENSIVE METABOLIC PANEL
ALT: 13 U/L (ref 0–44)
AST: 12 U/L — ABNORMAL LOW (ref 15–41)
Albumin: 4.5 g/dL (ref 3.5–5.0)
Alkaline Phosphatase: 74 U/L (ref 38–126)
Anion gap: 9 (ref 5–15)
BUN: 28 mg/dL — ABNORMAL HIGH (ref 8–23)
CO2: 26 mmol/L (ref 22–32)
Calcium: 9.7 mg/dL (ref 8.9–10.3)
Chloride: 101 mmol/L (ref 98–111)
Creatinine, Ser: 1.27 mg/dL — ABNORMAL HIGH (ref 0.44–1.00)
GFR calc Af Amer: 51 mL/min — ABNORMAL LOW (ref >60)
GFR calc non Af Amer: 44 mL/min — ABNORMAL LOW (ref >60)
Glucose, Bld: 315 mg/dL — ABNORMAL HIGH (ref 70–99)
Potassium: 4.2 mmol/L (ref 3.5–5.1)
Sodium: 136 mmol/L (ref 135–145)
Total Bilirubin: 0.7 mg/dL (ref 0.3–1.2)
Total Protein: 8.3 g/dL — ABNORMAL HIGH (ref 6.5–8.1)

## 2019-09-15 LAB — URINALYSIS, ROUTINE W REFLEX MICROSCOPIC
Bacteria, UA: NONE SEEN
Bilirubin Urine: NEGATIVE
Glucose, UA: >=500 mg/dL — AB
Ketones, ur: NEGATIVE mg/dL
Nitrite: NEGATIVE
Protein, ur: NEGATIVE mg/dL
Specific Gravity, Urine: 1.02 (ref 1.005–1.030)
pH: 5 (ref 5.0–8.0)

## 2019-09-15 LAB — LIPASE, BLOOD: Lipase: 24 U/L (ref 11–51)

## 2019-09-15 MED ORDER — MORPHINE SULFATE (PF) 2 MG/ML IV SOLN
2.0000 mg | Freq: Once | INTRAVENOUS | Status: AC
  Administered 2019-09-15: 2 mg via INTRAVENOUS
  Filled 2019-09-15: qty 1

## 2019-09-15 MED ORDER — MORPHINE SULFATE 15 MG PO TABS: 15.0000 mg | ORAL_TABLET | ORAL | 0 refills | Status: DC | PRN

## 2019-09-15 MED ORDER — KETOROLAC TROMETHAMINE 30 MG/ML IJ SOLN
15.0000 mg | Freq: Once | INTRAMUSCULAR | Status: AC
  Administered 2019-09-15: 09:00:00 15 mg via INTRAVENOUS
  Filled 2019-09-15: qty 1

## 2019-09-15 MED ORDER — SODIUM CHLORIDE 0.9 % IV BOLUS
1000.0000 mL | Freq: Once | INTRAVENOUS | Status: AC
  Administered 2019-09-15: 1000 mL via INTRAVENOUS

## 2019-09-15 MED ORDER — ONDANSETRON HCL 4 MG/2ML IJ SOLN
4.0000 mg | Freq: Once | INTRAMUSCULAR | Status: AC
  Administered 2019-09-15: 07:00:00 4 mg via INTRAVENOUS
  Filled 2019-09-15: qty 2

## 2019-09-15 MED ORDER — MORPHINE SULFATE (PF) 4 MG/ML IV SOLN
4.0000 mg | Freq: Once | INTRAVENOUS | Status: AC
  Administered 2019-09-15: 07:00:00 4 mg via INTRAVENOUS
  Filled 2019-09-15: qty 1

## 2019-09-15 MED ORDER — TAMSULOSIN HCL 0.4 MG PO CAPS: 0.4000 mg | ORAL_CAPSULE | Freq: Every day | ORAL | 0 refills | Status: DC

## 2019-09-15 MED ORDER — SODIUM CHLORIDE 0.9% FLUSH: 3.0000 mL | Freq: Once | INTRAVENOUS | Status: DC

## 2019-09-15 MED ORDER — ONDANSETRON 4 MG PO TBDP: ORAL_TABLET | ORAL | 0 refills | Status: DC

## 2019-09-15 NOTE — ED Provider Notes (Signed)
[facility] DEPT Provider Note   CSN: [id number] Arrival date & time: 09/15/19  0254     History   Chief Complaint Chief Complaint  Patient presents with  . Abdominal Pain  . Back Pain    HPI [name] is a 63 y.o. female.     HPI  This is a 63 year old female who presents with abdominal and back pain.  Onset of symptoms this morning 1 hour prior to arrival.  Acute in onset.  She has a history of diabetes, hypertension, hyperlipidemia.  She has never had pain like this before.  She rates her pain 8 out of 10.  No history of kidney stones.  Denies hematuria or dysuria.  Denies fevers.  States she felt well before she went to bed.  She has had an episode of nonbilious, nonbloody emesis.  Past Medical History:  Diagnosis Date  . Diabetes mellitus   . DM (diabetes mellitus) ([place]) 07/22/2019  . HLD (hyperlipidemia) 07/22/2019  . HTN (hypertension) 07/22/2019  . Hyperlipidemia   . Hypertension   . Obesity, unspecified   . Reflux esophagitis   . Type 2 diabetes mellitus without complication, without long-term current use of insulin ([place]) 07/22/2019  . Unspecified vitamin D deficiency     Patient Active Problem List   Diagnosis Date Noted  . Type 2 diabetes mellitus without complication, without long-term current use of insulin ([place]) 07/22/2019  . HLD (hyperlipidemia) 07/22/2019  . HTN (hypertension) 07/22/2019  . DM (diabetes mellitus) ([place]) 07/22/2019    Past Surgical History:  Procedure Laterality Date  . CHOLECYSTECTOMY       OB History   No obstetric history on file.      Home Medications    Prior to Admission medications   Medication Sig Start Date End Date Taking? Authorizing Provider  BD PEN NEEDLE NANO U/F 32G X 4 MM MISC USE 1  BID UTD 05/07/19   [provider]  benazepril (LOTENSIN) 20 MG tablet Take 1 tablet by mouth daily. 01/19/19   [provider]  chlorthalidone (HYGROTON) 25 MG tablet TK 1 T PO D  04/29/19   [provider]  cholecalciferol (VITAMIN D) 1000 units tablet Take 1,000 Units by mouth daily.    [provider]  ibuprofen (ADVIL,MOTRIN) 200 MG tablet Take 400 mg by mouth every 6 (six) hours as needed for fever, headache, mild pain, moderate pain or cramping.    [provider]  LEVEMIR FLEXTOUCH 100 UNIT/ML Pen Inject 20 Units as directed 2 (two) times a day.  02/10/19   [provider]  metFORMIN (GLUCOPHAGE) 1000 MG tablet Take 1,000 mg by mouth 2 (two) times daily with a meal.     [provider]  metoprolol succinate (TOPROL-XL) 25 MG 24 hr tablet TAKE 1 TABLET(25 MG) BY MOUTH DAILY 08/19/19   Miquel Dunn, NP  Potassium Chloride ER 20 MEQ TBCR Take 20 mEq by mouth daily. 12/20/18   [provider]  rosuvastatin (CRESTOR) 5 MG tablet Take 5 mg by mouth daily. 12/19/18   [provider]  triamterene-hydrochlorothiazide (MAXZIDE-25) 37.5-25 MG per tablet Take 1 tablet by mouth daily.    [provider]    Family History Family History  Problem Relation Age of Onset  . Diabetes Mother   . Hypertension Mother   . Diabetes Father   . Hypertension Father   . Diabetes Sister     Social History Social History   Tobacco Use  .  Smoking status: Never Smoker  . Smokeless tobacco: Never Used  Substance Use Topics  . Alcohol use: No  . Drug use: No     Allergies   Patient has no known allergies.   Review of Systems Review of Systems  Constitutional: Negative for fever.  Respiratory: Negative for shortness of breath.   Cardiovascular: Negative for chest pain.  Gastrointestinal: Positive for abdominal pain, nausea and vomiting.  Genitourinary: Positive for flank pain. Negative for dysuria and hematuria.  All other systems reviewed and are negative.    Physical Exam Updated Vital Signs BP (!) 159/91   Pulse 88   Temp 99.2 F (37.3 C) (Oral)   Resp 16   Ht 1.6 m (5\' 3" )   Wt 85.3 kg    SpO2 98%   BMI 33.30 kg/m   Physical Exam Vitals signs and nursing note reviewed.  Constitutional:      Appearance: She is well-developed. She is obese.  HENT:     Head: Normocephalic and atraumatic.  Eyes:     Pupils: Pupils are equal, round, and reactive to light.  Neck:     Musculoskeletal: Neck supple.  Cardiovascular:     Rate and Rhythm: Normal rate and regular rhythm.     Heart sounds: Normal heart sounds.  Pulmonary:     Effort: Pulmonary effort is normal. No respiratory distress.     Breath sounds: No wheezing.  Abdominal:     General: Bowel sounds are normal.     Palpations: Abdomen is soft.     Tenderness: There is abdominal tenderness in the right lower quadrant. There is right CVA tenderness. There is no guarding or rebound.  Skin:    General: Skin is warm and dry.  Neurological:     Mental Status: She is alert and oriented to person, place, and time.  Psychiatric:        Mood and Affect: Mood normal.      ED Treatments / Results  Labs (all labs ordered are listed, but only abnormal results are displayed) Labs Reviewed  COMPREHENSIVE METABOLIC PANEL - Abnormal; Notable for the following components:      Result Value   Glucose, Bld 315 (*)    BUN 28 (*)    Creatinine, Ser 1.27 (*)    Total Protein 8.3 (*)    AST 12 (*)    GFR calc non Af Amer 44 (*)    GFR calc Af Amer 51 (*)    All other components within normal limits  URINALYSIS, ROUTINE W REFLEX MICROSCOPIC - Abnormal; Notable for the following components:   Glucose, UA >=500 (*)    Hgb urine dipstick MODERATE (*)    Leukocytes,Ua TRACE (*)    All other components within normal limits  LIPASE, BLOOD  CBC    EKG None  Radiology No results found.  Procedures Procedures (including critical care time)  Medications Ordered in ED Medications  sodium chloride flush (NS) 0.9 % injection 3 mL (has no administration in time range)  morphine 4 MG/ML injection 4 mg (4 mg Intravenous Given  09/15/19 0700)  ondansetron (ZOFRAN) injection 4 mg (4 mg Intravenous Given 09/15/19 0659)  sodium chloride 0.9 % bolus 1,000 mL (1,000 mLs Intravenous New Bag/Given 09/15/19 0659)     Initial Impression / Assessment and Plan / ED Course  I have reviewed the triage vital signs and the nursing notes.  Pertinent labs & imaging results that were available during my care of the patient were  reviewed by me and considered in my medical decision making (see chart for details).        Patient presents with abdominal and flank pain.  Acute in onset.  History is highly suggestive of kidney stone.  Although she does have some tenderness on exam which would be abnormal.  She could also have a urinary tract infection.  CT obtained.  Patient given fluids, pain and nausea medication.  Patient signed out to oncoming provider.  Final Clinical Impressions(s) / ED Diagnoses   Final diagnoses:  None    ED Discharge Orders    None       Sharnay Cashion, Mayer Maskerourtney F, MD 09/15/19 [phone]

## 2019-09-15 NOTE — ED Provider Notes (Signed)
I see the patient in signout from Dr. Dina Rich.  Briefly the patient is a 63 year old female with a chief complaints of right lower quadrant abdominal tenderness.  CT stone study is pending.  Read has 2 stones one is 9 x 8 mm and the other is 5 x 4 mm both in the right UVJ.  She does have significant right hydronephrosis.  On my reexamination the patient feels much better.  Feels like she is able to go home.  Just based on the extent of the hydronephrosis and the multiple stones will discuss with urology.  I discussed case with the urologist who felt that the patient was feeling better than it would be reasonable to have her schedule an appointment and be seen in the office in the next 48 hours.  He recommend sending off a urine culture with findings on CT of the thickened bladder wall as well as the white and red cells in the urine.  When I went back to discussed with the patient she is now complaining of recurrence of her pain.  We will give more pain medicine and observe in the ED for short time.   Deno Etienne, DO 09/15/19 1437

## 2019-09-15 NOTE — Discharge Instructions (Addendum)
Take 4 over the counter ibuprofen tablets 3 times a day or 2 over-the-counter naproxen tablets twice a day for pain. Also take tylenol 1000mg (2 extra strength) four times a day.   Take medications as prescribed.  Return for fever her uncontrolled pain.  Call the urologist today to set up an appointment.  Then take the pain medicine if you feel like you need it. Narcotics do not help with the pain, they only make you care about it less.  You can become addicted to this, people may break into your house to steal it.  It will constipate you.  If you drive under the influence of this medicine you can get a DUI.

## 2019-09-15 NOTE — ED Notes (Signed)
Patient transported to CT 

## 2019-09-15 NOTE — ED Triage Notes (Signed)
Patient here from home with complaints of right sided abd pain radiating into back that started 3 hours ago. Denies n/v.

## 2019-09-16 ENCOUNTER — Emergency Department (HOSPITAL_COMMUNITY): Payer: Medicare Other | Admitting: Anesthesiology

## 2019-09-16 ENCOUNTER — Other Ambulatory Visit: Payer: Self-pay

## 2019-09-16 ENCOUNTER — Inpatient Hospital Stay (HOSPITAL_COMMUNITY)
Admission: EM | Admit: 2019-09-16 | Discharge: 2019-09-18 | DRG: 661 | Disposition: A | Discharge status: Home / Self Care | Payer: Medicare Other | Attending: Urology | Admitting: Urology

## 2019-09-16 ENCOUNTER — Emergency Department (HOSPITAL_COMMUNITY): Payer: Medicare Other

## 2019-09-16 ENCOUNTER — Encounter (HOSPITAL_COMMUNITY): Payer: Self-pay

## 2019-09-16 ENCOUNTER — Encounter (HOSPITAL_COMMUNITY)
Admission: EM | Disposition: A | Discharge status: Home / Self Care | Payer: Self-pay | Source: Home / Self Care | Attending: Urology

## 2019-09-16 DIAGNOSIS — N201 Calculus of ureter: Secondary | ICD-10-CM | POA: Diagnosis present

## 2019-09-16 DIAGNOSIS — E119 Type 2 diabetes mellitus without complications: Secondary | ICD-10-CM | POA: Diagnosis present

## 2019-09-16 DIAGNOSIS — I10 Essential (primary) hypertension: Secondary | ICD-10-CM | POA: Diagnosis present

## 2019-09-16 DIAGNOSIS — N136 Pyonephrosis: Principal | ICD-10-CM | POA: Diagnosis present

## 2019-09-16 DIAGNOSIS — Z833 Family history of diabetes mellitus: Secondary | ICD-10-CM | POA: Diagnosis not present

## 2019-09-16 DIAGNOSIS — Z20828 Contact with and (suspected) exposure to other viral communicable diseases: Secondary | ICD-10-CM | POA: Diagnosis present

## 2019-09-16 DIAGNOSIS — Z794 Long term (current) use of insulin: Secondary | ICD-10-CM | POA: Diagnosis not present

## 2019-09-16 DIAGNOSIS — Z8249 Family history of ischemic heart disease and other diseases of the circulatory system: Secondary | ICD-10-CM | POA: Diagnosis not present

## 2019-09-16 DIAGNOSIS — N179 Acute kidney failure, unspecified: Secondary | ICD-10-CM | POA: Diagnosis present

## 2019-09-16 DIAGNOSIS — Z6834 Body mass index (BMI) 34.0-34.9, adult: Secondary | ICD-10-CM | POA: Diagnosis not present

## 2019-09-16 DIAGNOSIS — E669 Obesity, unspecified: Secondary | ICD-10-CM | POA: Diagnosis present

## 2019-09-16 DIAGNOSIS — K2100 Gastro-esophageal reflux disease with esophagitis, without bleeding: Secondary | ICD-10-CM | POA: Diagnosis present

## 2019-09-16 DIAGNOSIS — N1330 Unspecified hydronephrosis: Secondary | ICD-10-CM

## 2019-09-16 DIAGNOSIS — E785 Hyperlipidemia, unspecified: Secondary | ICD-10-CM | POA: Diagnosis present

## 2019-09-16 DIAGNOSIS — F70 Mild intellectual disabilities: Secondary | ICD-10-CM | POA: Diagnosis present

## 2019-09-16 DIAGNOSIS — R1031 Right lower quadrant pain: Secondary | ICD-10-CM | POA: Diagnosis present

## 2019-09-16 DIAGNOSIS — N390 Urinary tract infection, site not specified: Secondary | ICD-10-CM

## 2019-09-16 HISTORY — PX: CYSTOSCOPY W/ URETERAL STENT PLACEMENT: SHX1429

## 2019-09-16 LAB — CBC WITH DIFFERENTIAL/PLATELET
Abs Immature Granulocytes: 0.06 10*3/uL (ref 0.00–0.07)
Basophils Absolute: 0 10*3/uL (ref 0.0–0.1)
Basophils Relative: 0 %
Eosinophils Absolute: 0 10*3/uL (ref 0.0–0.5)
Eosinophils Relative: 0 %
HCT: 38.5 % (ref 36.0–46.0)
Hemoglobin: 12.7 g/dL (ref 12.0–15.0)
Immature Granulocytes: 1 %
Lymphocytes Relative: 5 %
Lymphs Abs: 0.6 10*3/uL — ABNORMAL LOW (ref 0.7–4.0)
MCH: 28.8 pg (ref 26.0–34.0)
MCHC: 33 g/dL (ref 30.0–36.0)
MCV: 87.3 fL (ref 80.0–100.0)
Monocytes Absolute: 0.4 10*3/uL (ref 0.1–1.0)
Monocytes Relative: 4 %
Neutro Abs: 10.5 10*3/uL — ABNORMAL HIGH (ref 1.7–7.7)
Neutrophils Relative %: 90 %
Platelets: 219 10*3/uL (ref 150–400)
RBC: 4.41 MIL/uL (ref 3.87–5.11)
RDW: 13 % (ref 11.5–15.5)
WBC: 11.5 10*3/uL — ABNORMAL HIGH (ref 4.0–10.5)
nRBC: 0 % (ref 0.0–0.2)

## 2019-09-16 LAB — LACTIC ACID, PLASMA
Lactic Acid, Venous: 1.6 mmol/L (ref 0.5–1.9)
Lactic Acid, Venous: 1.7 mmol/L (ref 0.5–1.9)

## 2019-09-16 LAB — URINE CULTURE

## 2019-09-16 LAB — COMPREHENSIVE METABOLIC PANEL
ALT: 17 U/L (ref 0–44)
AST: 18 U/L (ref 15–41)
Albumin: 3.7 g/dL (ref 3.5–5.0)
Alkaline Phosphatase: 71 U/L (ref 38–126)
Anion gap: 11 (ref 5–15)
BUN: 31 mg/dL — ABNORMAL HIGH (ref 8–23)
CO2: 21 mmol/L — ABNORMAL LOW (ref 22–32)
Calcium: 8.9 mg/dL (ref 8.9–10.3)
Chloride: 102 mmol/L (ref 98–111)
Creatinine, Ser: 1.71 mg/dL — ABNORMAL HIGH (ref 0.44–1.00)
GFR calc Af Amer: 36 mL/min — ABNORMAL LOW (ref >60)
GFR calc non Af Amer: 31 mL/min — ABNORMAL LOW (ref >60)
Glucose, Bld: 283 mg/dL — ABNORMAL HIGH (ref 70–99)
Potassium: 3.9 mmol/L (ref 3.5–5.1)
Sodium: 134 mmol/L — ABNORMAL LOW (ref 135–145)
Total Bilirubin: 1.3 mg/dL — ABNORMAL HIGH (ref 0.3–1.2)
Total Protein: 7.3 g/dL (ref 6.5–8.1)

## 2019-09-16 LAB — GLUCOSE, CAPILLARY
Glucose-Capillary: 215 mg/dL — ABNORMAL HIGH (ref 70–99)
Glucose-Capillary: 169 mg/dL — ABNORMAL HIGH (ref 70–99)
Glucose-Capillary: 242 mg/dL — ABNORMAL HIGH (ref 70–99)

## 2019-09-16 LAB — SARS CORONAVIRUS 2 BY RT PCR (HOSPITAL ORDER, PERFORMED IN ~~LOC~~ HOSPITAL LAB): SARS Coronavirus 2: NEGATIVE

## 2019-09-16 LAB — URINALYSIS, ROUTINE W REFLEX MICROSCOPIC
Bilirubin Urine: NEGATIVE
Glucose, UA: 50 mg/dL — AB
Ketones, ur: 5 mg/dL — AB
Nitrite: NEGATIVE
Protein, ur: 100 mg/dL — AB
RBC / HPF: >50 RBC/hpf — ABNORMAL HIGH (ref 0–5)
Specific Gravity, Urine: 1.016 (ref 1.005–1.030)
WBC, UA: >50 WBC/hpf — ABNORMAL HIGH (ref 0–5)
pH: 5 (ref 5.0–8.0)

## 2019-09-16 LAB — PROTIME-INR
INR: 1 (ref 0.8–1.2)
Prothrombin Time: 13.5 seconds (ref 11.4–15.2)

## 2019-09-16 SURGERY — CYSTOSCOPY, WITH RETROGRADE PYELOGRAM AND URETERAL STENT INSERTION
Anesthesia: General | Laterality: Right

## 2019-09-16 MED ORDER — INSULIN ASPART 100 UNIT/ML ~~LOC~~ SOLN
0.0000 [IU] | SUBCUTANEOUS | Status: DC
  Administered 2019-09-16: 5 [IU] via SUBCUTANEOUS
  Administered 2019-09-17: 09:00:00 3 [IU] via SUBCUTANEOUS
  Administered 2019-09-17: 8 [IU] via SUBCUTANEOUS
  Administered 2019-09-17 (×3): 5 [IU] via SUBCUTANEOUS
  Administered 2019-09-17: 3 [IU] via SUBCUTANEOUS
  Administered 2019-09-18: 5 [IU] via SUBCUTANEOUS
  Administered 2019-09-18: 3 [IU] via SUBCUTANEOUS
  Administered 2019-09-18: 09:00:00 2 [IU] via SUBCUTANEOUS

## 2019-09-16 MED ORDER — MIDAZOLAM HCL 2 MG/2ML IJ SOLN
INTRAMUSCULAR | Status: AC
  Filled 2019-09-16: qty 2

## 2019-09-16 MED ORDER — ACETAMINOPHEN 10 MG/ML IV SOLN
1000.0000 mg | Freq: Once | INTRAVENOUS | Status: AC
  Administered 2019-09-16: 1000 mg via INTRAVENOUS

## 2019-09-16 MED ORDER — HYDROMORPHONE HCL 1 MG/ML IJ SOLN
0.5000 mg | INTRAMUSCULAR | Status: DC | PRN
  Filled 2019-09-16: qty 1

## 2019-09-16 MED ORDER — ONDANSETRON HCL 4 MG/2ML IJ SOLN: 4.0000 mg | INTRAMUSCULAR | Status: DC | PRN

## 2019-09-16 MED ORDER — PROPOFOL 10 MG/ML IV BOLUS
INTRAVENOUS | Status: DC | PRN
  Administered 2019-09-16: 160 mg via INTRAVENOUS

## 2019-09-16 MED ORDER — 0.9 % SODIUM CHLORIDE (POUR BTL) OPTIME
TOPICAL | Status: DC | PRN
  Administered 2019-09-16: 16:00:00 1000 mL

## 2019-09-16 MED ORDER — LIDOCAINE 2% (20 MG/ML) 5 ML SYRINGE
INTRAMUSCULAR | Status: DC | PRN
  Administered 2019-09-16: 60 mg via INTRAVENOUS

## 2019-09-16 MED ORDER — PANTOPRAZOLE SODIUM 40 MG PO TBEC
40.0000 mg | DELAYED_RELEASE_TABLET | Freq: Every day | ORAL | Status: DC
  Administered 2019-09-17 – 2019-09-18 (×2): 40 mg via ORAL
  Filled 2019-09-16 (×2): qty 1

## 2019-09-16 MED ORDER — OXYCODONE HCL 5 MG/5ML PO SOLN: 5.0000 mg | Freq: Once | ORAL | Status: DC | PRN

## 2019-09-16 MED ORDER — HYDROMORPHONE HCL 1 MG/ML IJ SOLN: 0.2500 mg | INTRAMUSCULAR | Status: DC | PRN

## 2019-09-16 MED ORDER — DIPHENHYDRAMINE HCL 12.5 MG/5ML PO ELIX: 12.5000 mg | ORAL_SOLUTION | Freq: Four times a day (QID) | ORAL | Status: DC | PRN

## 2019-09-16 MED ORDER — SODIUM CHLORIDE 0.9 % IV SOLN
1.0000 g | INTRAVENOUS | Status: DC
  Administered 2019-09-17: 14:00:00 1 g via INTRAVENOUS
  Filled 2019-09-16: qty 10
  Filled 2019-09-16: qty 1

## 2019-09-16 MED ORDER — SODIUM CHLORIDE 0.9 % IR SOLN
Status: DC | PRN
  Administered 2019-09-16: 1000 mL

## 2019-09-16 MED ORDER — DOCUSATE SODIUM 100 MG PO CAPS
100.0000 mg | ORAL_CAPSULE | Freq: Two times a day (BID) | ORAL | Status: DC
  Administered 2019-09-16 – 2019-09-18 (×3): 100 mg via ORAL
  Filled 2019-09-16 (×4): qty 1

## 2019-09-16 MED ORDER — PROPOFOL 10 MG/ML IV BOLUS
INTRAVENOUS | Status: AC
  Filled 2019-09-16: qty 20

## 2019-09-16 MED ORDER — MIDAZOLAM HCL 5 MG/5ML IJ SOLN
INTRAMUSCULAR | Status: DC | PRN
  Administered 2019-09-16: 2 mg via INTRAVENOUS

## 2019-09-16 MED ORDER — DIPHENHYDRAMINE HCL 50 MG/ML IJ SOLN: 12.5000 mg | Freq: Four times a day (QID) | INTRAMUSCULAR | Status: DC | PRN

## 2019-09-16 MED ORDER — OXYCODONE HCL 5 MG PO TABS
5.0000 mg | ORAL_TABLET | ORAL | Status: DC | PRN
  Administered 2019-09-16: 5 mg via ORAL
  Filled 2019-09-16 (×2): qty 1

## 2019-09-16 MED ORDER — SODIUM CHLORIDE 0.9 % IV SOLN
INTRAVENOUS | Status: DC
  Administered 2019-09-16 – 2019-09-17 (×3): via INTRAVENOUS

## 2019-09-16 MED ORDER — LACTATED RINGERS IV SOLN
INTRAVENOUS | Status: DC
  Administered 2019-09-16: 15:00:00 via INTRAVENOUS

## 2019-09-16 MED ORDER — METOPROLOL SUCCINATE ER 25 MG PO TB24
25.0000 mg | ORAL_TABLET | Freq: Every day | ORAL | Status: DC
  Administered 2019-09-18: 25 mg via ORAL
  Filled 2019-09-16 (×2): qty 1

## 2019-09-16 MED ORDER — ACETAMINOPHEN 325 MG PO TABS: 650.0000 mg | ORAL_TABLET | Freq: Once | ORAL | Status: DC

## 2019-09-16 MED ORDER — FENTANYL CITRATE (PF) 100 MCG/2ML IJ SOLN
INTRAMUSCULAR | Status: AC
  Filled 2019-09-16: qty 2

## 2019-09-16 MED ORDER — SODIUM CHLORIDE 0.9 % IV SOLN
2.0000 g | Freq: Once | INTRAVENOUS | Status: AC
  Administered 2019-09-16: 2 g via INTRAVENOUS
  Filled 2019-09-16: qty 20

## 2019-09-16 MED ORDER — ROSUVASTATIN CALCIUM 5 MG PO TABS
5.0000 mg | ORAL_TABLET | Freq: Every day | ORAL | Status: DC
  Administered 2019-09-17 – 2019-09-18 (×2): 5 mg via ORAL
  Filled 2019-09-16 (×2): qty 1

## 2019-09-16 MED ORDER — FENTANYL CITRATE (PF) 100 MCG/2ML IJ SOLN
INTRAMUSCULAR | Status: DC | PRN
  Administered 2019-09-16 (×2): 50 ug via INTRAVENOUS

## 2019-09-16 MED ORDER — DEXAMETHASONE SODIUM PHOSPHATE 10 MG/ML IJ SOLN
INTRAMUSCULAR | Status: DC | PRN
  Administered 2019-09-16: 5 mg via INTRAVENOUS

## 2019-09-16 MED ORDER — ONDANSETRON HCL 4 MG/2ML IJ SOLN
INTRAMUSCULAR | Status: DC | PRN
  Administered 2019-09-16: 4 mg via INTRAVENOUS

## 2019-09-16 MED ORDER — OXYCODONE HCL 5 MG PO TABS: 5.0000 mg | ORAL_TABLET | Freq: Once | ORAL | Status: DC | PRN

## 2019-09-16 MED ORDER — ACETAMINOPHEN 10 MG/ML IV SOLN
INTRAVENOUS | Status: AC
  Filled 2019-09-16: qty 100

## 2019-09-16 MED ORDER — PROMETHAZINE HCL 25 MG/ML IJ SOLN: 6.2500 mg | INTRAMUSCULAR | Status: DC | PRN

## 2019-09-16 MED ORDER — ACETAMINOPHEN 325 MG PO TABS
650.0000 mg | ORAL_TABLET | ORAL | Status: DC | PRN
  Administered 2019-09-18: 650 mg via ORAL
  Filled 2019-09-16: qty 2

## 2019-09-16 MED ORDER — CHLORTHALIDONE 25 MG PO TABS
25.0000 mg | ORAL_TABLET | Freq: Every day | ORAL | Status: DC
  Administered 2019-09-17 – 2019-09-18 (×2): 25 mg via ORAL
  Filled 2019-09-16 (×2): qty 1

## 2019-09-16 MED ORDER — ZOLPIDEM TARTRATE 5 MG PO TABS: 5.0000 mg | ORAL_TABLET | Freq: Every evening | ORAL | Status: DC | PRN

## 2019-09-16 SURGICAL SUPPLY — 14 items
BAG URO CATCHER STRL LF (MISCELLANEOUS) ×3 IMPLANT
CATH INTERMIT  6FR 70CM (CATHETERS) ×3 IMPLANT
CLOTH BEACON ORANGE TIMEOUT ST (SAFETY) ×3 IMPLANT
COVER WAND RF STERILE (DRAPES) IMPLANT
GLOVE BIOGEL M STRL SZ7.5 (GLOVE) ×7 IMPLANT
GOWN STRL REUS W/TWL LRG LVL3 (GOWN DISPOSABLE) ×6 IMPLANT
GUIDEWIRE STR DUAL SENSOR (WIRE) ×3 IMPLANT
KIT TURNOVER KIT A (KITS) IMPLANT
MANIFOLD NEPTUNE II (INSTRUMENTS) ×3 IMPLANT
PACK CYSTO (CUSTOM PROCEDURE TRAY) ×3 IMPLANT
STENT URET 6FRX26 CONTOUR (STENTS) ×2 IMPLANT
TUBING CONNECTING 10 (TUBING) ×2 IMPLANT
TUBING CONNECTING 10' (TUBING) ×1
TUBING UROLOGY SET (TUBING) IMPLANT

## 2019-09-16 NOTE — Anesthesia Preprocedure Evaluation (Addendum)
Anesthesia Evaluation  Patient identified by MRN, date of birth, ID band Patient awake    Reviewed: Allergy & Precautions, NPO status , Patient's Chart, lab work & pertinent test results  Airway Mallampati: II  TM Distance: >3 FB Neck ROM: Full    Dental  (+) Missing, Poor Dentition   Pulmonary neg pulmonary ROS,    Pulmonary exam normal breath sounds clear to auscultation       Cardiovascular hypertension, Pt. on medications negative cardio ROS Normal cardiovascular exam Rhythm:Regular Rate:Normal     Neuro/Psych negative neurological ROS  negative psych ROS   GI/Hepatic negative GI ROS, Neg liver ROS,   Endo/Other  negative endocrine ROSdiabetes, Type 2  Renal/GU negative Renal ROS  negative genitourinary   Musculoskeletal negative musculoskeletal ROS (+)   Abdominal (+) + obese,   Peds negative pediatric ROS (+)  Hematology negative hematology ROS (+)   Anesthesia Other Findings   Reproductive/Obstetrics negative OB ROS                            Anesthesia Physical Anesthesia Plan  ASA: III and emergent  Anesthesia Plan: General   Post-op Pain Management:    Induction: Intravenous  PONV Risk Score and Plan: 3 and Ondansetron, Dexamethasone, Midazolam and Treatment may vary due to age or medical condition  Airway Management Planned: LMA  Additional Equipment:   Intra-op Plan:   Post-operative Plan: Extubation in OR  Informed Consent: I have reviewed the patients History and Physical, chart, labs and discussed the procedure including the risks, benefits and alternatives for the proposed anesthesia with the patient or authorized representative who has indicated his/her understanding and acceptance.     Dental advisory given  Plan Discussed with: CRNA  Anesthesia Plan Comments:        Anesthesia Quick Evaluation

## 2019-09-16 NOTE — Transfer of Care (Signed)
Immediate Anesthesia Transfer of Care Note  Patient: [name]  Procedure(s) Performed: CYSTOSCOPY WITH RETROGRADE PYELOGRAM/URETERAL STENT PLACEMENT (Right )  Patient Location: PACU  Anesthesia Type:General  Level of Consciousness: sedated and responds to stimulation  Airway & Oxygen Therapy: Patient Spontanous Breathing and Patient connected to face mask oxygen  Post-op Assessment: Report given to RN and Post -op Vital signs reviewed and stable  Post vital signs: Reviewed and stable  Last Vitals:  Vitals Value Taken Time  BP 150/71 09/16/19 1642  Temp    Pulse 125 09/16/19 1643  Resp 18 09/16/19 1643  SpO2 100 % 09/16/19 1643  Vitals shown include unvalidated device data.  Last Pain:  Vitals:   09/16/19 1506  TempSrc: Oral  PainSc:          Complications: No apparent anesthesia complications

## 2019-09-16 NOTE — ED Triage Notes (Signed)
Pt BIBA from home. Pt was dx with kidney stones yesterday. Pt has been lethargic since d/c. Family called. Pt fell asleep on toilet.

## 2019-09-16 NOTE — Anesthesia Postprocedure Evaluation (Signed)
Anesthesia Post Note  Patient: [name]  Procedure(s) Performed: CYSTOSCOPY WITH RETROGRADE PYELOGRAM/URETERAL STENT PLACEMENT (Right )     Patient location during evaluation: PACU Anesthesia Type: General Level of consciousness: awake and alert Pain management: pain level controlled Vital Signs Assessment: post-procedure vital signs reviewed and stable Respiratory status: spontaneous breathing, nonlabored ventilation and respiratory function stable Cardiovascular status: blood pressure returned to baseline and stable Postop Assessment: no apparent nausea or vomiting Anesthetic complications: no    Last Vitals:  Vitals:   09/16/19 1800 09/16/19 1822  BP: (!) 145/66 (!) 142/62  Pulse: (!) 123 (!) 117  Resp: (!) 23   Temp: (!) 39.2 C (!) 38.7 C  SpO2: 96% 98%    Last Pain:  Vitals:   09/16/19 1825  TempSrc:   PainSc: 0-No pain                 Lynda Rainwater

## 2019-09-16 NOTE — ED Notes (Signed)
Pt transported to short stay 

## 2019-09-16 NOTE — ED Provider Notes (Signed)
[facility]-EMERGENCY DEPT Provider Note   CSN: [id number] Arrival date & time: 09/16/19  1125     History   Chief Complaint Chief Complaint  Patient presents with   Fever   Fatigue    HPI [name] is a 63 y.o. female.     HPI   She presents for evaluation of sleep onset has been present since she was seen in the emergency department yesterday.  At that time she was diagnosed with obstructing ureteral stones.  She was discharged on tamsulosin.  She has not taken any Tylenol or ibuprofen today.  She did not know that she had a fever.  She has mild mental retardation and her sister, with whom she lives, gives most of the history.  There is been no vomiting, change in bowel habits or reported trouble urinating.  There are no other known modifying factors.  Past Medical History:  Diagnosis Date   Diabetes mellitus    DM (diabetes mellitus) (HCC) 07/22/2019   HLD (hyperlipidemia) 07/22/2019   HTN (hypertension) 07/22/2019   Hyperlipidemia    Hypertension    Obesity, unspecified    Reflux esophagitis    Type 2 diabetes mellitus without complication, without long-term current use of insulin (HCC) 07/22/2019   Unspecified vitamin D deficiency     Patient Active Problem List   Diagnosis Date Noted   Right ureteral stone 09/16/2019   Type 2 diabetes mellitus without complication, without long-term current use of insulin (HCC) 07/22/2019   HLD (hyperlipidemia) 07/22/2019   HTN (hypertension) 07/22/2019   DM (diabetes mellitus) (HCC) 07/22/2019    Past Surgical History:  Procedure Laterality Date   CHOLECYSTECTOMY     CYSTOSCOPY W/ URETERAL STENT PLACEMENT Right 09/16/2019   Procedure: CYSTOSCOPY WITH RETROGRADE PYELOGRAM/URETERAL STENT PLACEMENT;  Surgeon: Heloise Purpura, MD;  Location: [facility];  Service: Urology;  Laterality: Right;     OB History   No obstetric history on file.      Home Medications    Prior to Admission  medications   Medication Sig Start Date End Date Taking? Authorizing Provider  acetaminophen (TYLENOL) 325 MG tablet Take 650 mg by mouth every 6 (six) hours as needed for mild pain or headache.   Yes [provider]  benazepril (LOTENSIN) 20 MG tablet Take 20 mg by mouth daily.  01/19/19  Yes [provider]  chlorthalidone (HYGROTON) 25 MG tablet Take 25 mg by mouth daily.  04/29/19  Yes [provider]  cholecalciferol (VITAMIN D) 1000 units tablet Take 1,000 Units by mouth daily.   Yes [provider]  ibuprofen (ADVIL) 200 MG tablet Take 600 mg by mouth every 6 (six) hours as needed for headache or moderate pain.   Yes [provider]  LEVEMIR FLEXTOUCH 100 UNIT/ML Pen Inject 20 Units as directed 2 (two) times a day.  02/10/19  Yes [provider]  metFORMIN (GLUCOPHAGE) 1000 MG tablet Take 1,000 mg by mouth 2 (two) times daily with a meal.    Yes [provider]  metoprolol succinate (TOPROL-XL) 25 MG 24 hr tablet TAKE 1 TABLET(25 MG) BY MOUTH DAILY Patient taking differently: Take 25 mg by mouth daily. TAKE 1 TABLET(25 MG) BY MOUTH DAILY 08/19/19  Yes Toniann Fail, NP  omeprazole (PRILOSEC) 40 MG capsule Take 40 mg by mouth daily as needed (indigestion).   Yes [provider]  Potassium Chloride ER 20 MEQ TBCR Take 20 mEq by mouth daily. 12/20/18  Yes [provider]  rosuvastatin (CRESTOR) 5 MG tablet Take 5 mg by mouth daily. 12/19/18  Yes [provider]  tamsulosin (FLOMAX) 0.4 MG CAPS capsule Take 1 capsule (0.4 mg total) by mouth daily after supper. 09/15/19  Yes Melene PlanFloyd, Dan, DO  triamterene-hydrochlorothiazide (MAXZIDE-25) 37.5-25 MG per tablet Take 1 tablet by mouth daily.   Yes [provider]  BD PEN NEEDLE NANO U/F 32G X 4 MM MISC USE 1  BID UTD 05/07/19   [provider]  morphine (MSIR) 15 MG tablet Take 1 tablet (15 mg total) by mouth every 4 (four) hours as needed for severe  pain. 09/15/19   Melene PlanFloyd, Dan, DO  ondansetron (ZOFRAN ODT) 4 MG disintegrating tablet 4mg  ODT q4 hours prn nausea/vomit Patient taking differently: Take 4 mg by mouth every 4 (four) hours as needed for nausea or vomiting. 4mg  ODT q4 hours prn nausea/vomit 09/15/19   Melene PlanFloyd, Dan, DO    Family History Family History  Problem Relation Age of Onset   Diabetes Mother    Hypertension Mother    Diabetes Father    Hypertension Father    Diabetes Sister     Social History Social History   Tobacco Use   Smoking status: Never Smoker   Smokeless tobacco: Never Used  Substance Use Topics   Alcohol use: No   Drug use: No     Allergies   Patient has no known allergies.   Review of Systems Review of Systems  All other systems reviewed and are negative.    Physical Exam Updated Vital Signs BP 106/74    Pulse 97    Temp 98.6 F (37 C) (Oral)    Resp 17    Ht 5\' 3"  (1.6 m)    Wt 87.5 kg    SpO2 97%    BMI 34.17 kg/m   Physical Exam Vitals signs and nursing note reviewed.  Constitutional:      General: She is not in acute distress.    Appearance: Normal appearance. She is well-developed. She is obese. She is not ill-appearing, toxic-appearing or diaphoretic.  HENT:     Head: Normocephalic and atraumatic.     Right Ear: External ear normal.     Left Ear: External ear normal.  Eyes:     Conjunctiva/sclera: Conjunctivae normal.     Pupils: Pupils are equal, round, and reactive to light.  Neck:     Musculoskeletal: Normal range of motion and neck supple.     Trachea: Phonation normal.  Cardiovascular:     Rate and Rhythm: Regular rhythm. Tachycardia present.     Heart sounds: Normal heart sounds.  Pulmonary:     Effort: Pulmonary effort is normal. No respiratory distress.     Breath sounds: Normal breath sounds. No stridor.  Abdominal:     General: There is no distension.     Palpations: Abdomen is soft.     Tenderness: There is no abdominal tenderness.    Musculoskeletal: Normal range of motion.  Skin:    General: Skin is warm and dry.  Neurological:     Mental Status: She is alert.     Cranial Nerves: No cranial nerve deficit.     Sensory: No sensory deficit.     Motor: No abnormal muscle tone.     Coordination: Coordination normal.     Comments: Alert, verbal, responsive.  No dysarthria or aphasia.  Somewhat poor historian.  Psychiatric:        Mood and Affect: Mood normal.  Behavior: Behavior normal.      ED Treatments / Results  Labs (all labs ordered are listed, but only abnormal results are displayed) Labs Reviewed  COMPREHENSIVE METABOLIC PANEL - Abnormal; Notable for the following components:      Result Value   Sodium 134 (*)    CO2 21 (*)    Glucose, Bld 283 (*)    BUN 31 (*)    Creatinine, Ser 1.71 (*)    Total Bilirubin 1.3 (*)    GFR calc non Af Amer 31 (*)    GFR calc Af Amer 36 (*)    All other components within normal limits  CBC WITH DIFFERENTIAL/PLATELET - Abnormal; Notable for the following components:   WBC 11.5 (*)    Neutro Abs 10.5 (*)    Lymphs Abs 0.6 (*)    All other components within normal limits  URINALYSIS, ROUTINE W REFLEX MICROSCOPIC - Abnormal; Notable for the following components:   Color, Urine RED (*)    APPearance CLOUDY (*)    Glucose, UA 50 (*)    Hgb urine dipstick LARGE (*)    Ketones, ur 5 (*)    Protein, ur 100 (*)    Leukocytes,Ua LARGE (*)    RBC / HPF >50 (*)    WBC, UA >50 (*)    Bacteria, UA MANY (*)    All other components within normal limits  GLUCOSE, CAPILLARY - Abnormal; Notable for the following components:   Glucose-Capillary 215 (*)    All other components within normal limits  GLUCOSE, CAPILLARY - Abnormal; Notable for the following components:   Glucose-Capillary 169 (*)    All other components within normal limits  BASIC METABOLIC PANEL - Abnormal; Notable for the following components:   CO2 21 (*)    Glucose, Bld 250 (*)    BUN 26 (*)     Creatinine, Ser 1.29 (*)    Calcium 8.4 (*)    GFR calc non Af Amer 43 (*)    GFR calc Af Amer 50 (*)    All other components within normal limits  HEMOGLOBIN A1C - Abnormal; Notable for the following components:   Hgb A1c MFr Bld 11.1 (*)    All other components within normal limits  GLUCOSE, CAPILLARY - Abnormal; Notable for the following components:   Glucose-Capillary 242 (*)    All other components within normal limits  GLUCOSE, CAPILLARY - Abnormal; Notable for the following components:   Glucose-Capillary 281 (*)    All other components within normal limits  GLUCOSE, CAPILLARY - Abnormal; Notable for the following components:   Glucose-Capillary 220 (*)    All other components within normal limits  GLUCOSE, CAPILLARY - Abnormal; Notable for the following components:   Glucose-Capillary 174 (*)    All other components within normal limits  CULTURE, BLOOD (ROUTINE X 2)  CULTURE, BLOOD (ROUTINE X 2)  SARS CORONAVIRUS 2 (HOSPITAL ORDER, [facility])  URINE CULTURE  URINE CULTURE  LACTIC ACID, PLASMA  LACTIC ACID, PLASMA  PROTIME-INR  HIV ANTIBODY (ROUTINE TESTING W REFLEX)  CBC  HIV4GL SAVE TUBE    EKG EKG Interpretation  Date/Time:  Wednesday September 16 2019 11:45:52 EDT Ventricular Rate:  111 PR Interval:    QRS Duration: 77 QT Interval:  313 QTC Calculation: 426 R Axis:   -46 Text Interpretation:  Sinus tachycardia Left anterior fascicular block Borderline low voltage, extremity leads since last tracing no significant change Confirmed by Daleen Bo [phone])  on 09/16/2019 12:30:23 PM   Radiology Ct Renal Stone Study  Addendum Date: 09/15/2019   ADDENDUM REPORT: 09/15/2019 08:05 ADDENDUM: Comment: Thickening of the urinary bladder wall likely represents a degree of cystitis. Electronically Signed   By: Bretta Bang III M.D.   On: 09/15/2019 08:05   Result Date: 09/15/2019 CLINICAL DATA:  Right flank pain EXAM: CT ABDOMEN AND  PELVIS WITHOUT CONTRAST TECHNIQUE: Multidetector CT imaging of the abdomen and pelvis was performed following the standard protocol without oral IV contrast. COMPARISON:  None. FINDINGS: Lower chest: There is bibasilar atelectatic change. There is no lung base edema or consolidation. There are foci of coronary artery calcification. Hepatobiliary: No focal liver lesions are evident on this noncontrast enhanced study. Gallbladder is absent. There is no appreciable biliary duct dilatation for post cholecystectomy state. Pancreas: There is no pancreatic mass or inflammatory focus. Spleen: No splenic lesions are evident. Adrenals/Urinary Tract: Right adrenal appears normal. There is calcification in the left adrenal. There is no associated left adrenal mass or left adrenal enlargement. Right kidney is edematous with perinephric stranding on the right. There is no evident renal mass on either side. There is severe hydronephrosis on the right. There is a calculus in the lower pole left kidney measuring 1.4 x 1.3 cm. No intrarenal calculus is noted on the right. There is a calculus at the right ureterovesical junction measuring 9 x 8 mm. There is a 5 x 4 mm calculus in the distal right ureter at the inferior right acetabular level. No other ureteral calculi are evident. The urinary bladder is midline. Urinary bladder wall appears thickened. Stomach/Bowel: There is no appreciable bowel wall or mesenteric thickening. Terminal ileum appears normal. There is no evident bowel obstruction. There is no free air or portal venous air. Vascular/Lymphatic: There is no abdominal aortic aneurysm. There is aortic atherosclerosis. There is no evident adenopathy in the abdomen or pelvis. Reproductive: Uterus is anteverted.  No pelvic masses evident. Other: Appendix appears normal. There is no evident abscess or ascites in the abdomen or pelvis. Musculoskeletal: There are no blastic or lytic bone lesions. There is moderate spinal stenosis  at L4-5 due to bony hypertrophy and diffuse disc protrusion. There is broad-based disc protrusion at L5-S1. There is no intramuscular or abdominal wall lesions. IMPRESSION: 1. Severe hydronephrosis on the right with right perinephric stranding and right renal edema. 9 x 8 mm calculus at the right ureterovesical junction. Slightly proximal to this calculus, there is a 5 x 4 mm distal right ureteral calculus as well. 2. Nonobstructing calculus in the lower pole left kidney measuring 1.4 x 1.3 cm. 3. Benign-appearing calcification in the left adrenal. Question prior hemorrhage within the left adrenal. No associated mass or adrenal enlargement. 4. Spinal stenosis at L4-5 due to bony hypertrophy and disc protrusion. A broad-based disc protrusion also noted at L5-S1. 5. No bowel obstruction. No abscess in the abdomen pelvis. Appendix appears normal. 6.  Gallbladder absent. 7.  Aortic Atherosclerosis (ICD10-I70.0). Electronically Signed: By: Bretta Bang III M.D. On: 09/15/2019 07:54    Procedures .Critical Care Performed by: Mancel Bale, MD Authorized by: Mancel Bale, MD   Critical care provider statement:    Critical care time (minutes):  35   Critical care start time:  09/16/2019 11:53 AM   Critical care end time:  09/16/2019 12:53 PM   Critical care time was exclusive of:  Separately billable procedures and treating other patients   Critical care was necessary to treat or prevent imminent  or life-threatening deterioration of the following conditions:  Sepsis   Critical care was time spent personally by me on the following activities:  Blood draw for specimens, development of treatment plan with patient or surrogate, discussions with consultants, evaluation of patient's response to treatment, examination of patient, obtaining history from patient or surrogate, ordering and performing treatments and interventions, ordering and review of laboratory studies, pulse oximetry, re-evaluation of patient's  condition, review of old charts and ordering and review of radiographic studies   (including critical care time)  Medications Ordered in ED Medications  acetaminophen (TYLENOL) tablet 650 mg ( Oral MAR Unhold 09/16/19 1821)  lactated ringers infusion ( Intravenous Anesthesia Volume Adjustment 09/16/19 1631)  chlorthalidone (HYGROTON) tablet 25 mg (25 mg Oral Given 09/17/19 0957)  metoprolol succinate (TOPROL-XL) 24 hr tablet 25 mg (25 mg Oral Not Given 09/17/19 0954)  pantoprazole (PROTONIX) EC tablet 40 mg (40 mg Oral Given 09/17/19 0957)  rosuvastatin (CRESTOR) tablet 5 mg (5 mg Oral Given 09/17/19 0953)  0.9 %  sodium chloride infusion ( Intravenous New Bag/Given 09/17/19 0538)  cefTRIAXone (ROCEPHIN) 1 g in sodium chloride 0.9 % 100 mL IVPB (has no administration in time range)  acetaminophen (TYLENOL) tablet 650 mg (has no administration in time range)  oxyCODONE (Oxy IR/ROXICODONE) immediate release tablet 5 mg (5 mg Oral Given 09/16/19 2247)  HYDROmorphone (DILAUDID) injection 0.5-1 mg (has no administration in time range)  zolpidem (AMBIEN) tablet 5 mg (has no administration in time range)  diphenhydrAMINE (BENADRYL) injection 12.5 mg (has no administration in time range)    Or  diphenhydrAMINE (BENADRYL) 12.5 MG/5ML elixir 12.5 mg (has no administration in time range)  docusate sodium (COLACE) capsule 100 mg (100 mg Oral Not Given 09/17/19 0957)  ondansetron (ZOFRAN) injection 4 mg (has no administration in time range)  insulin aspart (novoLOG) injection 0-15 Units (3 Units Subcutaneous Given 09/17/19 0844)  acetaminophen (OFIRMEV) 10 MG/ML IV (has no administration in time range)  cefTRIAXone (ROCEPHIN) 2 g in sodium chloride 0.9 % 100 mL IVPB ( Intravenous Stopped 09/16/19 1300)  acetaminophen (OFIRMEV) IV 1,000 mg (0 mg Intravenous Stopped 09/16/19 1706)  fentaNYL (SUBLIMAZE) 100 MCG/2ML injection (has no administration in time range)  propofol (DIPRIVAN) 10 mg/mL bolus/IV push (has no  administration in time range)  midazolam (VERSED) 2 MG/2ML injection (has no administration in time range)     Initial Impression / Assessment and Plan / ED Course  I have reviewed the triage vital signs and the nursing notes.  Pertinent labs & imaging results that were available during my care of the patient were reviewed by me and considered in my medical decision making (see chart for details).  Clinical Course as of Sep 17 1011  Wed Sep 16, 2019  1247 Case discussed with Dr. Crecencio Mc, who will see the patient in the ED to arrange for ureteral stenting.  COVID test will have to be returned prior to that procedure.   [EW]  1247 Normal except white count elevated  CBC with Differential(!) [EW]  1247 Normal  Protime-INR [EW]  Thu Sep 17, 2019  1011 Abnormal, presence of glucose, hemoglobin, ketones, protein, leukocytes, RBCs and WBCs and bacteria.  Urinalysis, Routine w reflex microscopic(!) [EW]  1011 Normal except sodium low, CO2 low, glucose high, BUN high, creatinine high, total bilirubin high, GFR low  Comprehensive metabolic panel(!) [EW]    Clinical Course User Index [EW] Mancel Bale, MD        Patient Vitals for the past  24 hrs:  BP Temp Temp src Pulse Resp SpO2 Height Weight  09/17/19 0953 106/74 -- -- 97 -- -- -- --  09/17/19 0433 104/65 98.6 F (37 C) Oral 92 17 97 % -- --  09/17/19 0131 (!) 102/59 98.1 F (36.7 C) Oral 98 18 100 % -- --  09/16/19 2302 111/67 98.4 F (36.9 C) Oral (!) 101 18 96 % -- --  09/16/19 2043 115/62 -- -- (!) 105 -- 100 % -- --  09/16/19 2023 115/62 98.3 F (36.8 C) Oral (!) 105 -- -- -- --  09/16/19 1829 -- -- -- -- -- -- 5\' 3"  (1.6 m) 87.5 kg  09/16/19 1822 (!) 142/62 (!) 101.6 F (38.7 C) -- (!) 117 -- 98 % -- --  09/16/19 1800 (!) 145/66 (!) 102.5 F (39.2 C) -- (!) 123 (!) 23 96 % -- --  09/16/19 1745 137/69 -- -- (!) 123 (!) 23 96 % -- --  09/16/19 1730 (!) 141/69 (!) 103.1 F (39.5 C) -- (!) 125 (!) 25 96 % -- --   09/16/19 1715 (!) 142/65 -- -- (!) 117 (!) 21 100 % -- --  09/16/19 1700 (!) 147/75 -- -- (!) 123 19 98 % -- --  09/16/19 1645 (!) 155/82 (!) 103.1 F (39.5 C) -- (!) 123 15 100 % -- --  09/16/19 1643 (!) 150/71 (!) 103.4 F (39.7 C) -- (!) 125 16 100 % -- --  09/16/19 1506 (!) 143/78 (!) 101.8 F (38.8 C) Oral (!) 114 18 97 % -- --  09/16/19 1459 -- -- -- -- -- -- 5\' 3"  (1.6 m) 86.2 kg  09/16/19 1430 123/74 -- -- (!) 109 (!) 24 96 % -- --  09/16/19 1400 133/68 -- -- (!) 107 18 96 % -- --  09/16/19 1330 126/68 -- -- (!) 108 (!) 26 96 % -- --  09/16/19 1300 124/67 -- -- (!) 115 16 95 % -- --  09/16/19 1257 -- (!) 102.1 F (38.9 C) Rectal -- -- -- -- --  09/16/19 1230 134/67 -- -- (!) 112 (!) 22 96 % -- --  09/16/19 1200 132/63 -- -- (!) 109 17 96 % -- --  09/16/19 1153 133/69 -- -- -- -- -- -- --  09/16/19 1144 -- -- -- -- -- -- -- 85 kg  09/16/19 1142 -- (!) 102.5 F (39.2 C) Rectal (!) 113 (!) 26 93 % -- --  09/16/19 1132 -- -- -- -- -- 94 % -- --      Medical Decision Making: Evaluation consistent with urinary tract infection, complicated by obstructing right ureteral stones.  She is not overtly septic.  She will require ureteral stenting to improve urine flow and therefore the chances of improving UTI with antibiotics.  CRITICAL CARE-yes Performed by: 09/18/19  Nursing Notes Reviewed/ Care Coordinated Applicable Imaging Reviewed Interpretation of Laboratory Data incorporated into ED treatment   Plan-ureteral stenting per urology   Final Clinical Impressions(s) / ED Diagnoses   Final diagnoses:  Urinary tract infection without hematuria, site unspecified  Right ureteral stone  Hydronephrosis, unspecified hydronephrosis type    ED Discharge Orders    None       09/18/19, MD 09/17/19 1015

## 2019-09-16 NOTE — Anesthesia Procedure Notes (Signed)
Procedure Name: LMA Insertion Performed by: Brayon Bielefeld J, CRNA Pre-anesthesia Checklist: Patient identified, Emergency Drugs available, Suction available, Patient being monitored and Timeout performed Patient Re-evaluated:Patient Re-evaluated prior to induction Oxygen Delivery Method: Circle system utilized Preoxygenation: Pre-oxygenation with 100% oxygen Induction Type: IV induction Ventilation: Mask ventilation without difficulty LMA: LMA inserted LMA Size: 4.0 Number of attempts: 1 Placement Confirmation: positive ETCO2 and breath sounds checked- equal and bilateral Tube secured with: Tape Dental Injury: Teeth and Oropharynx as per pre-operative assessment        

## 2019-09-16 NOTE — Consult Note (Signed)
Urology Consult   Physician requesting consult: Dr. Elliot Wentz  Reason for consult: Right ureteral calculi and fever  History of Present Illness: [name] is a 63 y.o. who presented to the ED yesterday with severe right lower quadrant pain.  CT imaging revealed two right distal ureteral calculi.  Her pain was controllable and she was discharged home with plans for outpatient follow up.  She developed fever to 102 overnight and returned to the ED today.  She continues to have pain.  Urine culture from yesterday appears contaminated.  She is tachycardic but otherwise hemodynamically stable.  COVID test pending (obtatined at 12 PM).  She does have a history of kidney stones with prior surgical procedures.    Past Medical History:  Diagnosis Date  . Diabetes mellitus   . DM (diabetes mellitus) (HCC) 07/22/2019  . HLD (hyperlipidemia) 07/22/2019  . HTN (hypertension) 07/22/2019  . Hyperlipidemia   . Hypertension   . Obesity, unspecified   . Reflux esophagitis   . Type 2 diabetes mellitus without complication, without long-term current use of insulin (HCC) 07/22/2019  . Unspecified vitamin D deficiency     Past Surgical History:  Procedure Laterality Date  . CHOLECYSTECTOMY       Current Hospital Medications:  Home meds:  No current facility-administered medications on file prior to encounter.    Current Outpatient Medications on File Prior to Encounter  Medication Sig Dispense Refill  . acetaminophen (TYLENOL) 325 MG tablet Take 650 mg by mouth every 6 (six) hours as needed for mild pain or headache.    . benazepril (LOTENSIN) 20 MG tablet Take 20 mg by mouth daily.     . chlorthalidone (HYGROTON) 25 MG tablet Take 25 mg by mouth daily.     . cholecalciferol (VITAMIN D) 1000 units tablet Take 1,000 Units by mouth daily.    . ibuprofen (ADVIL) 200 MG tablet Take 600 mg by mouth every 6 (six) hours as needed for headache or moderate pain.    . LEVEMIR FLEXTOUCH 100 UNIT/ML Pen  Inject 20 Units as directed 2 (two) times a day.     . metFORMIN (GLUCOPHAGE) 1000 MG tablet Take 1,000 mg by mouth 2 (two) times daily with a meal.     . metoprolol succinate (TOPROL-XL) 25 MG 24 hr tablet TAKE 1 TABLET(25 MG) BY MOUTH DAILY (Patient taking differently: Take 25 mg by mouth daily. TAKE 1 TABLET(25 MG) BY MOUTH DAILY) 90 tablet 3  . omeprazole (PRILOSEC) 40 MG capsule Take 40 mg by mouth daily as needed (indigestion).    . Potassium Chloride ER 20 MEQ TBCR Take 20 mEq by mouth daily.    . rosuvastatin (CRESTOR) 5 MG tablet Take 5 mg by mouth daily.    . tamsulosin (FLOMAX) 0.4 MG CAPS capsule Take 1 capsule (0.4 mg total) by mouth daily after supper. 30 capsule 0  . triamterene-hydrochlorothiazide (MAXZIDE-25) 37.5-25 MG per tablet Take 1 tablet by mouth daily.    . BD PEN NEEDLE NANO U/F 32G X 4 MM MISC USE 1  BID UTD    . morphine (MSIR) 15 MG tablet Take 1 tablet (15 mg total) by mouth every 4 (four) hours as needed for severe pain. 7 tablet 0  . ondansetron (ZOFRAN ODT) 4 MG disintegrating tablet 4mg ODT q4 hours prn nausea/vomit (Patient taking differently: Take 4 mg by mouth every 4 (four) hours as needed for nausea or vomiting. 4mg ODT q4 hours prn nausea/vomit) 20 tablet 0       Scheduled Meds: . acetaminophen  650 mg Oral Once   Continuous Infusions: PRN Meds:.  Allergies: No Known Allergies  Family History  Problem Relation Age of Onset  . Diabetes Mother   . Hypertension Mother   . Diabetes Father   . Hypertension Father   . Diabetes Sister     Social History:  reports that she has never smoked. She has never used smokeless tobacco. She reports that she does not drink alcohol or use drugs.  ROS: A complete review of systems was performed.  All systems are negative except for pertinent findings as noted.  Physical Exam:  Vital signs in last 24 hours: Temp:  [102.1 F (38.9 C)-102.5 F (39.2 C)] 102.1 F (38.9 C) (09/30 1257) Pulse Rate:  [109-115] 115  (09/30 1300) Resp:  [16-26] 16 (09/30 1300) BP: (124-134)/(63-69) 124/67 (09/30 1300) SpO2:  [93 %-96 %] 95 % (09/30 1300) Weight:  [85 kg] 85 kg (09/30 1144) Constitutional:  Alert and oriented, No acute distress Cardiovascular: Regular rate and rhythm, No JVD Respiratory: Normal respiratory effort, Lungs clear bilaterally GI: Abdomen is soft, tender over right abdomen GU: Moderate right CVA tenderness Lymphatic: No lymphadenopathy Neurologic: Grossly intact, no focal deficits Psychiatric: Normal mood and affect  Laboratory Data:  Recent Labs    09/15/19 0642 09/16/19 1200  WBC 8.5 11.5*  HGB 13.8 12.7  HCT 42.2 38.5  PLT 266 219    Recent Labs    09/15/19 0642 09/16/19 1200  NA 136 134*  K 4.2 3.9  CL 101 102  GLUCOSE 315* 283*  BUN 28* 31*  CALCIUM 9.7 8.9  CREATININE 1.27* 1.71*     Results for orders placed or performed during the hospital encounter of 09/16/19 (from the past 24 hour(s))  Comprehensive metabolic panel     Status: Abnormal   Collection Time: 09/16/19 12:00 PM  Result Value Ref Range   Sodium 134 (L) 135 - 145 mmol/L   Potassium 3.9 3.5 - 5.1 mmol/L   Chloride 102 98 - 111 mmol/L   CO2 21 (L) 22 - 32 mmol/L   Glucose, Bld 283 (H) 70 - 99 mg/dL   BUN 31 (H) 8 - 23 mg/dL   Creatinine, Ser 1.611.71 (H) 0.44 - 1.00 mg/dL   Calcium 8.9 8.9 - 09.610.3 mg/dL   Total Protein 7.3 6.5 - 8.1 g/dL   Albumin 3.7 3.5 - 5.0 g/dL   AST 18 15 - 41 U/L   ALT 17 0 - 44 U/L   Alkaline Phosphatase 71 38 - 126 U/L   Total Bilirubin 1.3 (H) 0.3 - 1.2 mg/dL   GFR calc non Af Amer 31 (L) >60 mL/min   GFR calc Af Amer 36 (L) >60 mL/min   Anion gap 11 5 - 15  Lactic acid, plasma     Status: None   Collection Time: 09/16/19 12:00 PM  Result Value Ref Range   Lactic Acid, Venous 1.6 0.5 - 1.9 mmol/L  CBC with Differential     Status: Abnormal   Collection Time: 09/16/19 12:00 PM  Result Value Ref Range   WBC 11.5 (H) 4.0 - 10.5 K/uL   RBC 4.41 3.87 - 5.11 MIL/uL    Hemoglobin 12.7 12.0 - 15.0 g/dL   HCT 04.538.5 40.936.0 - 81.146.0 %   MCV 87.3 80.0 - 100.0 fL   MCH 28.8 26.0 - 34.0 pg   MCHC 33.0 30.0 - 36.0 g/dL   RDW 91.413.0 78.211.5 - 95.615.5 %   Platelets  219 150 - 400 K/uL   nRBC 0.0 0.0 - 0.2 %   Neutrophils Relative % 90 %   Neutro Abs 10.5 (H) 1.7 - 7.7 K/uL   Lymphocytes Relative 5 %   Lymphs Abs 0.6 (L) 0.7 - 4.0 K/uL   Monocytes Relative 4 %   Monocytes Absolute 0.4 0.1 - 1.0 K/uL   Eosinophils Relative 0 %   Eosinophils Absolute 0.0 0.0 - 0.5 K/uL   Basophils Relative 0 %   Basophils Absolute 0.0 0.0 - 0.1 K/uL   Immature Granulocytes 1 %   Abs Immature Granulocytes 0.06 0.00 - 0.07 K/uL  Protime-INR     Status: None   Collection Time: 09/16/19 12:00 PM  Result Value Ref Range   Prothrombin Time 13.5 11.4 - 15.2 seconds   INR 1.0 0.8 - 1.2   Recent Results (from the past 240 hour(s))  Urine culture     Status: Abnormal   Collection Time: 09/15/19  6:29 AM   Specimen: Urine, Catheterized  Result Value Ref Range Status   Specimen Description   Final    URINE, CATHETERIZED Performed at [facility], [address] [place], [place] [address]    Special Requests   Final    NONE Performed at [facility], [address], [place], [place] [address]    Culture MULTIPLE SPECIES PRESENT, SUGGEST RECOLLECTION (A)  Final   Report Status 09/16/2019 FINAL  Final    Renal Function: Recent Labs    09/15/19 9562 09/16/19 1200  CREATININE 1.27* 1.71*   Estimated Creatinine Clearance: 33.9 mL/min (A) (by C-G formula based on SCr of 1.71 mg/dL (H)).  Radiologic Imaging: Dg Chest 2 View  Result Date: 09/16/2019 CLINICAL DATA:  Sepsis. EXAM: CHEST - 2 VIEW COMPARISON:  None. FINDINGS: The heart size and mediastinal contours are within normal limits. Both lungs are clear. The visualized skeletal structures are unremarkable. IMPRESSION: No active cardiopulmonary disease. Electronically Signed   By: Lupita Raider  M.D.   On: 09/16/2019 13:00   Ct Renal Stone Study  Addendum Date: 09/15/2019   ADDENDUM REPORT: 09/15/2019 08:05 ADDENDUM: Comment: Thickening of the urinary bladder wall likely represents a degree of cystitis. Electronically Signed   By: Bretta Bang III M.D.   On: 09/15/2019 08:05   Result Date: 09/15/2019 CLINICAL DATA:  Right flank pain EXAM: CT ABDOMEN AND PELVIS WITHOUT CONTRAST TECHNIQUE: Multidetector CT imaging of the abdomen and pelvis was performed following the standard protocol without oral IV contrast. COMPARISON:  None. FINDINGS: Lower chest: There is bibasilar atelectatic change. There is no lung base edema or consolidation. There are foci of coronary artery calcification. Hepatobiliary: No focal liver lesions are evident on this noncontrast enhanced study. Gallbladder is absent. There is no appreciable biliary duct dilatation for post cholecystectomy state. Pancreas: There is no pancreatic mass or inflammatory focus. Spleen: No splenic lesions are evident. Adrenals/Urinary Tract: Right adrenal appears normal. There is calcification in the left adrenal. There is no associated left adrenal mass or left adrenal enlargement. Right kidney is edematous with perinephric stranding on the right. There is no evident renal mass on either side. There is severe hydronephrosis on the right. There is a calculus in the lower pole left kidney measuring 1.4 x 1.3 cm. No intrarenal calculus is noted on the right. There is a calculus at the right ureterovesical junction measuring 9 x 8 mm. There is a 5 x 4 mm calculus in the distal right ureter  at the inferior right acetabular level. No other ureteral calculi are evident. The urinary bladder is midline. Urinary bladder wall appears thickened. Stomach/Bowel: There is no appreciable bowel wall or mesenteric thickening. Terminal ileum appears normal. There is no evident bowel obstruction. There is no free air or portal venous air. Vascular/Lymphatic: There is  no abdominal aortic aneurysm. There is aortic atherosclerosis. There is no evident adenopathy in the abdomen or pelvis. Reproductive: Uterus is anteverted.  No pelvic masses evident. Other: Appendix appears normal. There is no evident abscess or ascites in the abdomen or pelvis. Musculoskeletal: There are no blastic or lytic bone lesions. There is moderate spinal stenosis at L4-5 due to bony hypertrophy and diffuse disc protrusion. There is broad-based disc protrusion at L5-S1. There is no intramuscular or abdominal wall lesions. IMPRESSION: 1. Severe hydronephrosis on the right with right perinephric stranding and right renal edema. 9 x 8 mm calculus at the right ureterovesical junction. Slightly proximal to this calculus, there is a 5 x 4 mm distal right ureteral calculus as well. 2. Nonobstructing calculus in the lower pole left kidney measuring 1.4 x 1.3 cm. 3. Benign-appearing calcification in the left adrenal. Question prior hemorrhage within the left adrenal. No associated mass or adrenal enlargement. 4. Spinal stenosis at L4-5 due to bony hypertrophy and disc protrusion. A broad-based disc protrusion also noted at L5-S1. 5. No bowel obstruction. No abscess in the abdomen pelvis. Appendix appears normal. 6.  Gallbladder absent. 7.  Aortic Atherosclerosis (ICD10-I70.0). Electronically Signed: By: Lowella Grip III M.D. On: 09/15/2019 07:54    I independently reviewed the above imaging studies.  Impression/Recommendation: Right ureteral stones and fever: I have recommended urgent cystoscopy and right ureteral stent placement. She will begin therapy with IV antibiotics and will be admitted pending culture results.  Will re-culture urine due to contaminated specimen from yesterday.  I discussed the potential benefits and risks of the procedure, side effects of the proposed treatment, the likelihood of the patient achieving the goals of the procedure, and any potential problems that might occur during  the procedure or recuperation. I have discussed this plan with her POA, her sister Ms. Bertell Maria, and they are both agreeable to this plan.  Dutch Gray 09/16/2019, 1:31 PM  Pryor Curia. MD   CC: Dr. Christ Kick

## 2019-09-16 NOTE — H&P (View-Only) (Signed)
Urology Consult   Physician requesting consult: Dr. Gray Bernhardt  Reason for consult: Right ureteral calculi and fever  History of Present Illness: [name] is a 63 y.o. who presented to the ED yesterday with severe right lower quadrant pain.  CT imaging revealed two right distal ureteral calculi.  Her pain was controllable and she was discharged home with plans for outpatient follow up.  She developed fever to 102 overnight and returned to the ED today.  She continues to have pain.  Urine culture from yesterday appears contaminated.  She is tachycardic but otherwise hemodynamically stable.  COVID test pending (obtatined at 12 PM).  She does have a history of kidney stones with prior surgical procedures.    Past Medical History:  Diagnosis Date  . Diabetes mellitus   . DM (diabetes mellitus) (HCC) 07/22/2019  . HLD (hyperlipidemia) 07/22/2019  . HTN (hypertension) 07/22/2019  . Hyperlipidemia   . Hypertension   . Obesity, unspecified   . Reflux esophagitis   . Type 2 diabetes mellitus without complication, without long-term current use of insulin (HCC) 07/22/2019  . Unspecified vitamin D deficiency     Past Surgical History:  Procedure Laterality Date  . CHOLECYSTECTOMY       Current Hospital Medications:  Home meds:  No current facility-administered medications on file prior to encounter.    Current Outpatient Medications on File Prior to Encounter  Medication Sig Dispense Refill  . acetaminophen (TYLENOL) 325 MG tablet Take 650 mg by mouth every 6 (six) hours as needed for mild pain or headache.    . benazepril (LOTENSIN) 20 MG tablet Take 20 mg by mouth daily.     . chlorthalidone (HYGROTON) 25 MG tablet Take 25 mg by mouth daily.     . cholecalciferol (VITAMIN D) 1000 units tablet Take 1,000 Units by mouth daily.    Marland Kitchen ibuprofen (ADVIL) 200 MG tablet Take 600 mg by mouth every 6 (six) hours as needed for headache or moderate pain.    Marland Kitchen LEVEMIR FLEXTOUCH 100 UNIT/ML Pen  Inject 20 Units as directed 2 (two) times a day.     . metFORMIN (GLUCOPHAGE) 1000 MG tablet Take 1,000 mg by mouth 2 (two) times daily with a meal.     . metoprolol succinate (TOPROL-XL) 25 MG 24 hr tablet TAKE 1 TABLET(25 MG) BY MOUTH DAILY (Patient taking differently: Take 25 mg by mouth daily. TAKE 1 TABLET(25 MG) BY MOUTH DAILY) 90 tablet 3  . omeprazole (PRILOSEC) 40 MG capsule Take 40 mg by mouth daily as needed (indigestion).    . Potassium Chloride ER 20 MEQ TBCR Take 20 mEq by mouth daily.    . rosuvastatin (CRESTOR) 5 MG tablet Take 5 mg by mouth daily.    . tamsulosin (FLOMAX) 0.4 MG CAPS capsule Take 1 capsule (0.4 mg total) by mouth daily after supper. 30 capsule 0  . triamterene-hydrochlorothiazide (MAXZIDE-25) 37.5-25 MG per tablet Take 1 tablet by mouth daily.    . BD PEN NEEDLE NANO U/F 32G X 4 MM MISC USE 1  BID UTD    . morphine (MSIR) 15 MG tablet Take 1 tablet (15 mg total) by mouth every 4 (four) hours as needed for severe pain. 7 tablet 0  . ondansetron (ZOFRAN ODT) 4 MG disintegrating tablet  ODT q4 hours prn nausea/vomit (Patient taking differently: Take 4 mg by mouth every 4 (four) hours as needed for nausea or vomiting.  ODT q4 hours prn nausea/vomit) 20 tablet 0  Scheduled Meds: . acetaminophen  650 mg Oral Once   Continuous Infusions: PRN Meds:.  Allergies: No Known Allergies  Family History  Problem Relation Age of Onset  . Diabetes Mother   . Hypertension Mother   . Diabetes Father   . Hypertension Father   . Diabetes Sister     Social History:  reports that she has never smoked. She has never used smokeless tobacco. She reports that she does not drink alcohol or use drugs.  ROS: A complete review of systems was performed.  All systems are negative except for pertinent findings as noted.  Physical Exam:  Vital signs in last 24 hours: Temp:  [102.1 F (38.9 C)-102.5 F (39.2 C)] 102.1 F (38.9 C) (09/30 1257) Pulse Rate:  [109-115] 115  (09/30 1300) Resp:  [16-26] 16 (09/30 1300) BP: (124-134)/(63-69) 124/67 (09/30 1300) SpO2:  [93 %-96 %] 95 % (09/30 1300) Weight:  [85 kg] 85 kg (09/30 1144) Constitutional:  Alert and oriented, No acute distress Cardiovascular: Regular rate and rhythm, No JVD Respiratory: Normal respiratory effort, Lungs clear bilaterally GI: Abdomen is soft, tender over right abdomen GU: Moderate right CVA tenderness Lymphatic: No lymphadenopathy Neurologic: Grossly intact, no focal deficits Psychiatric: Normal mood and affect  Laboratory Data:  Recent Labs    09/15/19 0642 09/16/19 1200  WBC 8.5 11.5*  HGB 13.8 12.7  HCT 42.2 38.5  PLT 266 219    Recent Labs    09/15/19 0642 09/16/19 1200  NA 136 134*  K 4.2 3.9  CL 101 102  GLUCOSE 315* 283*  BUN 28* 31*  CALCIUM 9.7 8.9  CREATININE 1.27* 1.71*     Results for orders placed or performed during the hospital encounter of 09/16/19 (from the past 24 hour(s))  Comprehensive metabolic panel     Status: Abnormal   Collection Time: 09/16/19 12:00 PM  Result Value Ref Range   Sodium 134 (L) 135 - 145 mmol/L   Potassium 3.9 3.5 - 5.1 mmol/L   Chloride 102 98 - 111 mmol/L   CO2 21 (L) 22 - 32 mmol/L   Glucose, Bld 283 (H) 70 - 99 mg/dL   BUN 31 (H) 8 - 23 mg/dL   Creatinine, Ser 1.611.71 (H) 0.44 - 1.00 mg/dL   Calcium 8.9 8.9 - 09.610.3 mg/dL   Total Protein 7.3 6.5 - 8.1 g/dL   Albumin 3.7 3.5 - 5.0 g/dL   AST 18 15 - 41 U/L   ALT 17 0 - 44 U/L   Alkaline Phosphatase 71 38 - 126 U/L   Total Bilirubin 1.3 (H) 0.3 - 1.2 mg/dL   GFR calc non Af Amer 31 (L) >60 mL/min   GFR calc Af Amer 36 (L) >60 mL/min   Anion gap 11 5 - 15  Lactic acid, plasma     Status: None   Collection Time: 09/16/19 12:00 PM  Result Value Ref Range   Lactic Acid, Venous 1.6 0.5 - 1.9 mmol/L  CBC with Differential     Status: Abnormal   Collection Time: 09/16/19 12:00 PM  Result Value Ref Range   WBC 11.5 (H) 4.0 - 10.5 K/uL   RBC 4.41 3.87 - 5.11 MIL/uL    Hemoglobin 12.7 12.0 - 15.0 g/dL   HCT 04.538.5 40.936.0 - 81.146.0 %   MCV 87.3 80.0 - 100.0 fL   MCH 28.8 26.0 - 34.0 pg   MCHC 33.0 30.0 - 36.0 g/dL   RDW 91.413.0 78.211.5 - 95.615.5 %   Platelets  219 150 - 400 K/uL   nRBC 0.0 0.0 - 0.2 %   Neutrophils Relative % 90 %   Neutro Abs 10.5 (H) 1.7 - 7.7 K/uL   Lymphocytes Relative 5 %   Lymphs Abs 0.6 (L) 0.7 - 4.0 K/uL   Monocytes Relative 4 %   Monocytes Absolute 0.4 0.1 - 1.0 K/uL   Eosinophils Relative 0 %   Eosinophils Absolute 0.0 0.0 - 0.5 K/uL   Basophils Relative 0 %   Basophils Absolute 0.0 0.0 - 0.1 K/uL   Immature Granulocytes 1 %   Abs Immature Granulocytes 0.06 0.00 - 0.07 K/uL  Protime-INR     Status: None   Collection Time: 09/16/19 12:00 PM  Result Value Ref Range   Prothrombin Time 13.5 11.4 - 15.2 seconds   INR 1.0 0.8 - 1.2   Recent Results (from the past 240 hour(s))  Urine culture     Status: Abnormal   Collection Time: 09/15/19  6:29 AM   Specimen: Urine, Catheterized  Result Value Ref Range Status   Specimen Description   Final    URINE, CATHETERIZED Performed at [facility], [address] [place], [place] [address]    Special Requests   Final    NONE Performed at [facility], [address], [place], [place] [address]    Culture MULTIPLE SPECIES PRESENT, SUGGEST RECOLLECTION (A)  Final   Report Status 09/16/2019 FINAL  Final    Renal Function: Recent Labs    09/15/19 9562 09/16/19 1200  CREATININE 1.27* 1.71*   Estimated Creatinine Clearance: 33.9 mL/min (A) (by C-G formula based on SCr of 1.71 mg/dL (H)).  Radiologic Imaging: Dg Chest 2 View  Result Date: 09/16/2019 CLINICAL DATA:  Sepsis. EXAM: CHEST - 2 VIEW COMPARISON:  None. FINDINGS: The heart size and mediastinal contours are within normal limits. Both lungs are clear. The visualized skeletal structures are unremarkable. IMPRESSION: No active cardiopulmonary disease. Electronically Signed   By: Lupita Raider  M.D.   On: 09/16/2019 13:00   Ct Renal Stone Study  Addendum Date: 09/15/2019   ADDENDUM REPORT: 09/15/2019 08:05 ADDENDUM: Comment: Thickening of the urinary bladder wall likely represents a degree of cystitis. Electronically Signed   By: Bretta Bang III M.D.   On: 09/15/2019 08:05   Result Date: 09/15/2019 CLINICAL DATA:  Right flank pain EXAM: CT ABDOMEN AND PELVIS WITHOUT CONTRAST TECHNIQUE: Multidetector CT imaging of the abdomen and pelvis was performed following the standard protocol without oral IV contrast. COMPARISON:  None. FINDINGS: Lower chest: There is bibasilar atelectatic change. There is no lung base edema or consolidation. There are foci of coronary artery calcification. Hepatobiliary: No focal liver lesions are evident on this noncontrast enhanced study. Gallbladder is absent. There is no appreciable biliary duct dilatation for post cholecystectomy state. Pancreas: There is no pancreatic mass or inflammatory focus. Spleen: No splenic lesions are evident. Adrenals/Urinary Tract: Right adrenal appears normal. There is calcification in the left adrenal. There is no associated left adrenal mass or left adrenal enlargement. Right kidney is edematous with perinephric stranding on the right. There is no evident renal mass on either side. There is severe hydronephrosis on the right. There is a calculus in the lower pole left kidney measuring 1.4 x 1.3 cm. No intrarenal calculus is noted on the right. There is a calculus at the right ureterovesical junction measuring 9 x 8 mm. There is a 5 x 4 mm calculus in the distal right ureter  at the inferior right acetabular level. No other ureteral calculi are evident. The urinary bladder is midline. Urinary bladder wall appears thickened. Stomach/Bowel: There is no appreciable bowel wall or mesenteric thickening. Terminal ileum appears normal. There is no evident bowel obstruction. There is no free air or portal venous air. Vascular/Lymphatic: There is  no abdominal aortic aneurysm. There is aortic atherosclerosis. There is no evident adenopathy in the abdomen or pelvis. Reproductive: Uterus is anteverted.  No pelvic masses evident. Other: Appendix appears normal. There is no evident abscess or ascites in the abdomen or pelvis. Musculoskeletal: There are no blastic or lytic bone lesions. There is moderate spinal stenosis at L4-5 due to bony hypertrophy and diffuse disc protrusion. There is broad-based disc protrusion at L5-S1. There is no intramuscular or abdominal wall lesions. IMPRESSION: 1. Severe hydronephrosis on the right with right perinephric stranding and right renal edema. 9 x 8 mm calculus at the right ureterovesical junction. Slightly proximal to this calculus, there is a 5 x 4 mm distal right ureteral calculus as well. 2. Nonobstructing calculus in the lower pole left kidney measuring 1.4 x 1.3 cm. 3. Benign-appearing calcification in the left adrenal. Question prior hemorrhage within the left adrenal. No associated mass or adrenal enlargement. 4. Spinal stenosis at L4-5 due to bony hypertrophy and disc protrusion. A broad-based disc protrusion also noted at L5-S1. 5. No bowel obstruction. No abscess in the abdomen pelvis. Appendix appears normal. 6.  Gallbladder absent. 7.  Aortic Atherosclerosis (ICD10-I70.0). Electronically Signed: By: Lowella Grip III M.D. On: 09/15/2019 07:54    I independently reviewed the above imaging studies.  Impression/Recommendation: Right ureteral stones and fever: I have recommended urgent cystoscopy and right ureteral stent placement. She will begin therapy with IV antibiotics and will be admitted pending culture results.  Will re-culture urine due to contaminated specimen from yesterday.  I discussed the potential benefits and risks of the procedure, side effects of the proposed treatment, the likelihood of the patient achieving the goals of the procedure, and any potential problems that might occur during  the procedure or recuperation. I have discussed this plan with her POA, her sister Ms. Bertell Maria, and they are both agreeable to this plan.  Dutch Gray 09/16/2019, 1:31 PM  Pryor Curia. MD   CC: Dr. Christ Kick

## 2019-09-16 NOTE — Op Note (Signed)
Preoperative diagnosis:  1. Right ureteral calculi and fever   Postoperative diagnosis:  1. Right ureteral calculi and fever   Procedure:  1. Cystoscopy 2. Right ureteral stent placement (6 x 26 - no string)  Surgeon: Roxy Horseman, Brooke Bonito. M.D.  Anesthesia: General  Complications: None  Intraoperative findings: Purulent drainage was seen from the right ureteral orifice.  EBL: Minimal  Specimens: Culture from right renal pelvis  Indication: [name] is a 63 y.o. patient with right ureteral calculi and fever. After reviewing the management options for treatment, she elected to proceed with the above surgical procedure(s). We have discussed the potential benefits and risks of the procedure, side effects of the proposed treatment, the likelihood of the patient achieving the goals of the procedure, and any potential problems that might occur during the procedure or recuperation. Informed consent has been obtained.  Description of procedure:  The patient was taken to the operating room and general anesthesia was induced.  The patient was placed in the dorsal lithotomy position, prepped and draped in the usual sterile fashion, and preoperative antibiotics were administered. A preoperative time-out was performed.   Cystourethroscopy was performed.  The patient's urethra was examined and was normal. The bladder was then systematically examined in its entirety. There was no evidence for any bladder tumors, stones, or other mucosal pathology.    Attention then turned to the right ureteral orifice and a ureteral catheter was used to intubate the ureteral orifice.  A 0.38 sensor guidewire was then advanced up the right ureter into the renal pelvis under fluoroscopic guidance.  The ureteral catheter was advanced into the renal pelvis and culture was obtained. The wire was then replaced.  A ureteral stent was advance over the wire using Seldinger technique.  The stent was positioned  appropriately under fluoroscopic and cystoscopic guidance.  The wire was then removed with an adequate stent curl noted in the renal pelvis as well as in the bladder.  The bladder was then emptied and the procedure ended.  The patient appeared to tolerate the procedure well and without complications.  The patient was able to be awakened and transferred to the recovery unit in satisfactory condition.    Pryor Curia MD

## 2019-09-17 ENCOUNTER — Encounter (HOSPITAL_COMMUNITY): Payer: Self-pay | Admitting: Urology

## 2019-09-17 LAB — BASIC METABOLIC PANEL
Anion gap: 8 (ref 5–15)
BUN: 26 mg/dL — ABNORMAL HIGH (ref 8–23)
CO2: 21 mmol/L — ABNORMAL LOW (ref 22–32)
Calcium: 8.4 mg/dL — ABNORMAL LOW (ref 8.9–10.3)
Chloride: 108 mmol/L (ref 98–111)
Creatinine, Ser: 1.29 mg/dL — ABNORMAL HIGH (ref 0.44–1.00)
GFR calc Af Amer: 50 mL/min — ABNORMAL LOW (ref >60)
GFR calc non Af Amer: 43 mL/min — ABNORMAL LOW (ref >60)
Glucose, Bld: 250 mg/dL — ABNORMAL HIGH (ref 70–99)
Potassium: 4 mmol/L (ref 3.5–5.1)
Sodium: 137 mmol/L (ref 135–145)

## 2019-09-17 LAB — CBC
HCT: 37.9 % (ref 36.0–46.0)
Hemoglobin: 12.2 g/dL (ref 12.0–15.0)
MCH: 28.6 pg (ref 26.0–34.0)
MCHC: 32.2 g/dL (ref 30.0–36.0)
MCV: 89 fL (ref 80.0–100.0)
Platelets: 204 10*3/uL (ref 150–400)
RBC: 4.26 MIL/uL (ref 3.87–5.11)
RDW: 13.2 % (ref 11.5–15.5)
WBC: 9.8 10*3/uL (ref 4.0–10.5)
nRBC: 0 % (ref 0.0–0.2)

## 2019-09-17 LAB — URINE CULTURE
Culture: NO GROWTH
Culture: NO GROWTH

## 2019-09-17 LAB — HIV ANTIBODY (ROUTINE TESTING W REFLEX): HIV Screen 4th Generation wRfx: NONREACTIVE

## 2019-09-17 LAB — HEMOGLOBIN A1C
Hgb A1c MFr Bld: 11.1 % — ABNORMAL HIGH (ref 4.8–5.6)
Mean Plasma Glucose: 271.87 mg/dL

## 2019-09-17 LAB — GLUCOSE, CAPILLARY
Glucose-Capillary: 174 mg/dL — ABNORMAL HIGH (ref 70–99)
Glucose-Capillary: 181 mg/dL — ABNORMAL HIGH (ref 70–99)
Glucose-Capillary: 206 mg/dL — ABNORMAL HIGH (ref 70–99)
Glucose-Capillary: 220 mg/dL — ABNORMAL HIGH (ref 70–99)
Glucose-Capillary: 241 mg/dL — ABNORMAL HIGH (ref 70–99)
Glucose-Capillary: 281 mg/dL — ABNORMAL HIGH (ref 70–99)

## 2019-09-17 NOTE — Progress Notes (Signed)
Patient ID: [name], female   DOB: [birth year], 63 y.o.   MRN: [id number]  1 Day Post-Op Subjective: Pt feeling better.  No pain.  Fever to 103 last night.  Afebrile now.  Objective: Vital signs in last 24 hours: Temp:  [98.1 F (36.7 C)-103.4 F (39.7 C)] 98.6 F (37 C) (10/01 0433) Pulse Rate:  [92-125] 92 (10/01 0433) Resp:  [15-26] 17 (10/01 0433) BP: (102-155)/(59-82) 104/65 (10/01 0433) SpO2:  [93 %-100 %] 97 % (10/01 0433) Weight:  [85 kg-87.5 kg] 87.5 kg (09/30 1829)  Intake/Output from previous day: 09/30 0701 - 10/01 0700 In: 1582.3 [I.V.:1382.2; IV Piggyback:200.1] Out: 1650 [Urine:1650] Intake/Output this shift: No intake/output data recorded.  Physical Exam:  General: Alert and oriented Abdomen: Soft, ND, NT, No CVAT   Lab Results: Recent Labs    09/15/19 0642 09/16/19 1200 09/17/19 0455  HGB 13.8 12.7 12.2  HCT 42.2 38.5 37.9   BMET Recent Labs    09/16/19 1200 09/17/19 0455  NA 134* 137  K 3.9 4.0  CL 102 108  CO2 21* 21*  GLUCOSE 283* 250*  BUN 31* 26*  CREATININE 1.71* 1.29*  CALCIUM 8.9 8.4*     Studies/Results: Dg Chest 2 View  Result Date: 09/16/2019 CLINICAL DATA:  Sepsis. EXAM: CHEST - 2 VIEW COMPARISON:  None. FINDINGS: The heart size and mediastinal contours are within normal limits. Both lungs are clear. The visualized skeletal structures are unremarkable. IMPRESSION: No active cardiopulmonary disease. Electronically Signed   By: Lupita Raider M.D.   On: 09/16/2019 13:00   Dg C-arm 1-60 Min-no Report  Result Date: 09/16/2019 Fluoroscopy was utilized by the requesting physician.  No radiographic interpretation.   Ct Renal Stone Study  Addendum Date: 09/15/2019   ADDENDUM REPORT: 09/15/2019 08:05 ADDENDUM: Comment: Thickening of the urinary bladder wall likely represents a degree of cystitis. Electronically Signed   By: Bretta Bang III M.D.   On: 09/15/2019 08:05   Result Date: 09/15/2019 CLINICAL DATA:  Right flank  pain EXAM: CT ABDOMEN AND PELVIS WITHOUT CONTRAST TECHNIQUE: Multidetector CT imaging of the abdomen and pelvis was performed following the standard protocol without oral IV contrast. COMPARISON:  None. FINDINGS: Lower chest: There is bibasilar atelectatic change. There is no lung base edema or consolidation. There are foci of coronary artery calcification. Hepatobiliary: No focal liver lesions are evident on this noncontrast enhanced study. Gallbladder is absent. There is no appreciable biliary duct dilatation for post cholecystectomy state. Pancreas: There is no pancreatic mass or inflammatory focus. Spleen: No splenic lesions are evident. Adrenals/Urinary Tract: Right adrenal appears normal. There is calcification in the left adrenal. There is no associated left adrenal mass or left adrenal enlargement. Right kidney is edematous with perinephric stranding on the right. There is no evident renal mass on either side. There is severe hydronephrosis on the right. There is a calculus in the lower pole left kidney measuring 1.4 x 1.3 cm. No intrarenal calculus is noted on the right. There is a calculus at the right ureterovesical junction measuring 9 x 8 mm. There is a 5 x 4 mm calculus in the distal right ureter at the inferior right acetabular level. No other ureteral calculi are evident. The urinary bladder is midline. Urinary bladder wall appears thickened. Stomach/Bowel: There is no appreciable bowel wall or mesenteric thickening. Terminal ileum appears normal. There is no evident bowel obstruction. There is no free air or portal venous air. Vascular/Lymphatic: There is no abdominal aortic aneurysm. There is  aortic atherosclerosis. There is no evident adenopathy in the abdomen or pelvis. Reproductive: Uterus is anteverted.  No pelvic masses evident. Other: Appendix appears normal. There is no evident abscess or ascites in the abdomen or pelvis. Musculoskeletal: There are no blastic or lytic bone lesions. There is  moderate spinal stenosis at L4-5 due to bony hypertrophy and diffuse disc protrusion. There is broad-based disc protrusion at L5-S1. There is no intramuscular or abdominal wall lesions. IMPRESSION: 1. Severe hydronephrosis on the right with right perinephric stranding and right renal edema. 9 x 8 mm calculus at the right ureterovesical junction. Slightly proximal to this calculus, there is a 5 x 4 mm distal right ureteral calculus as well. 2. Nonobstructing calculus in the lower pole left kidney measuring 1.4 x 1.3 cm. 3. Benign-appearing calcification in the left adrenal. Question prior hemorrhage within the left adrenal. No associated mass or adrenal enlargement. 4. Spinal stenosis at L4-5 due to bony hypertrophy and disc protrusion. A broad-based disc protrusion also noted at L5-S1. 5. No bowel obstruction. No abscess in the abdomen pelvis. Appendix appears normal. 6.  Gallbladder absent. 7.  Aortic Atherosclerosis (ICD10-I70.0). Electronically Signed: By: Lowella Grip III M.D. On: 09/15/2019 07:54    Assessment/Plan: 1) Right pyelonephritis and right ureteral calculi:  Continue ceftriaxone.  Cultures pending.  Will need 2 weeks of appropriate antibiotic therapy before definitive treatment of stones.   LOS: 1 day   Dutch Gray 09/17/2019, 7:09 AM

## 2019-09-17 NOTE — Progress Notes (Signed)
Inpatient Diabetes Program Recommendations  AACE/ADA: New Consensus Statement on Inpatient Glycemic Control (2015)  Target Ranges:  Prepandial:   less than 140 mg/dL      Peak postprandial:   less than 180 mg/dL (1-2 hours)      Critically ill patients:  140 - 180 mg/dL   Lab Results  Component Value Date   GLUCAP 241 (H) 09/17/2019   HGBA1C 11.1 (H) 09/17/2019    Review of Glycemic Control  Diabetes history: DM2 Outpatient Diabetes medications: Levemir 20 units bid, metformin 1000 mg bid Current orders for Inpatient glycemic control: Novolog 0-15 units Q4H  HgbA1C - 11.1% - uncontrolled  Inpatient Diabetes Program Recommendations:     Add 1/2 home dose of Levemir - 10 units bid.  Will attempt to speak with pt and sister regarding elevated HgbA1C.  Will follow.  Thank you. Lorenda Peck, RD, LDN, CDE Inpatient Diabetes Coordinator [phone]

## 2019-09-18 ENCOUNTER — Other Ambulatory Visit: Payer: Self-pay | Admitting: Urology

## 2019-09-18 LAB — GLUCOSE, CAPILLARY
Glucose-Capillary: 133 mg/dL — ABNORMAL HIGH (ref 70–99)
Glucose-Capillary: 153 mg/dL — ABNORMAL HIGH (ref 70–99)
Glucose-Capillary: 235 mg/dL — ABNORMAL HIGH (ref 70–99)

## 2019-09-18 MED ORDER — CEFDINIR 300 MG PO CAPS: 300.0000 mg | ORAL_CAPSULE | Freq: Two times a day (BID) | ORAL | 0 refills | Status: DC

## 2019-09-18 NOTE — Discharge Instructions (Signed)

## 2019-09-18 NOTE — Discharge Summary (Signed)
Date of admission: 09/16/2019  Date of discharge: 09/18/2019  Admission diagnosis: Right ureteral calculi and pyelonephritis  Discharge diagnosis: Right ureteral calculi and pyelonephritis  Secondary diagnoses: Diabetes, AKI  History and Physical: For full details, please see admission history and physical. Briefly, [name] is a 63 y.o. year old patient with multiple right ureteral calculi and fever.   Hospital Course: She underwent urgent right ureteral stent placement with purulent drainage noted intraoperatively form the right ureter.  Her fever was 103 postoperatively and she was maintained on IV ceftriaxone.  She defervesced and had been afebrile for over 24 hrs on 09/18/19.  All cultures demonstrated no growth and she was felt stable for discharge with continued oral empiric antibiotics.  Her Cr was initially elevated upon presentation but this improved after stent placement as well.  Laboratory values:  Recent Labs    09/16/19 1200 09/17/19 0455  HGB 12.7 12.2  HCT 38.5 37.9   Recent Labs    09/16/19 1200 09/17/19 0455  CREATININE 1.71* 1.29*    Disposition: Home  Discharge instruction: The patient was instructed to be ambulatory but told to refrain from heavy lifting, strenuous activity, or driving.  Discharge medications:  Allergies as of 09/18/2019   No Known Allergies     Medication List    STOP taking these medications   acetaminophen 325 MG tablet Commonly known as: TYLENOL   BD Pen Needle Nano U/F 32G X 4 MM Misc Generic drug: Insulin Pen Needle   ibuprofen 200 MG tablet Commonly known as: ADVIL     TAKE these medications   benazepril 20 MG tablet Commonly known as: LOTENSIN Take 20 mg by mouth daily.   cefdinir 300 MG capsule Commonly known as: OMNICEF Take 1 capsule (300 mg total) by mouth 2 (two) times daily.   chlorthalidone 25 MG tablet Commonly known as: HYGROTON Take 25 mg by mouth daily.   cholecalciferol 1000 units  tablet Commonly known as: VITAMIN D Take 1,000 Units by mouth daily.   Levemir FlexTouch 100 UNIT/ML Pen Generic drug: Insulin Detemir Inject 20 Units as directed 2 (two) times a day.   metFORMIN 1000 MG tablet Commonly known as: GLUCOPHAGE Take 1,000 mg by mouth 2 (two) times daily with a meal.   metoprolol succinate 25 MG 24 hr tablet Commonly known as: TOPROL-XL TAKE 1 TABLET(25 MG) BY MOUTH DAILY What changed:   how much to take  how to take this  when to take this   morphine 15 MG tablet Commonly known as: MSIR Take 1 tablet (15 mg total) by mouth every 4 (four) hours as needed for severe pain.   omeprazole 40 MG capsule Commonly known as: PRILOSEC Take 40 mg by mouth daily as needed (indigestion).   ondansetron 4 MG disintegrating tablet Commonly known as: Zofran ODT 4mg  ODT q4 hours prn nausea/vomit What changed:   how much to take  how to take this  when to take this  reasons to take this   Potassium Chloride ER 20 MEQ Tbcr Take 20 mEq by mouth daily.   rosuvastatin 5 MG tablet Commonly known as: CRESTOR Take 5 mg by mouth daily.   tamsulosin 0.4 MG Caps capsule Commonly known as: FLOMAX Take 1 capsule (0.4 mg total) by mouth daily after supper.   triamterene-hydrochlorothiazide 37.5-25 MG tablet Commonly known as: MAXZIDE-25 Take 1 tablet by mouth daily.       Followup:  Follow-up Information    Raynelle Bring, MD Follow up.  Specialty: Urology Why: Will call to arrange surgery to remove kidney stones Contact information: [address] [place] [address] [phone]

## 2019-09-18 NOTE — Plan of Care (Signed)
?  Problem: Education: ?Goal: Knowledge of General Education information will improve ?Description: Including pain rating scale, medication(s)/side effects and non-pharmacologic comfort measures ?Outcome: Progressing ?  ?Problem: Activity: ?Goal: Risk for activity intolerance will decrease ?Outcome: Progressing ?  ?Problem: Nutrition: ?Goal: Adequate nutrition will be maintained ?Outcome: Progressing ?  ?Problem: Coping: ?Goal: Level of anxiety will decrease ?Outcome: Progressing ?  ?Problem: Elimination: ?Goal: Will not experience complications related to urinary retention ?Outcome: Progressing ?  ?Problem: Pain Managment: ?Goal: General experience of comfort will improve ?Outcome: Progressing ?  ?

## 2019-09-18 NOTE — Progress Notes (Signed)
Patient ID: [name], female   DOB: January 24, 1956, 63 y.o.   MRN: [id number]  2 Days Post-Op Subjective: Doing well.  No fever over last 24 hrs.  Denies pain.  Complains of urinary frequency.  Objective: Vital signs in last 24 hours: Temp:  [98.2 F (36.8 C)-98.8 F (37.1 C)] 98.8 F (37.1 C) (10/02 0456) Pulse Rate:  [89-97] 91 (10/02 0456) Resp:  [16-18] 16 (10/02 0456) BP: (106-122)/(62-75) 108/62 (10/02 0456) SpO2:  [96 %-99 %] 96 % (10/02 0456)  Intake/Output from previous day: 10/01 0701 - 10/02 0700 In: 2827.9 [P.O.:1130; I.V.:1598; IV Piggyback:99.9] Out: 1800 [Urine:1800] Intake/Output this shift: No intake/output data recorded.  Physical Exam:  General: Alert and oriented Abdomen: Soft, ND, No CVAT Ext: NT, No erythema  Lab Results: Recent Labs    09/16/19 1200 09/17/19 0455  HGB 12.7 12.2  HCT 38.5 37.9   CBC Latest Ref Rng & Units 09/17/2019 09/16/2019 09/15/2019  WBC 4.0 - 10.5 K/uL 9.8 11.5(H) 8.5  Hemoglobin 12.0 - 15.0 g/dL 12.2 12.7 13.8  Hematocrit 36.0 - 46.0 % 37.9 38.5 42.2  Platelets 150 - 400 K/uL 204 219 266     BMET Recent Labs    09/16/19 1200 09/17/19 0455  NA 134* 137  K 3.9 4.0  CL 102 108  CO2 21* 21*  GLUCOSE 283* 250*  BUN 31* 26*  CREATININE 1.71* 1.29*  CALCIUM 8.9 8.4*     Studies/Results:   Assessment/Plan: Right ureteral stones /pyelonephritis - All cultures with no growth so far.  Will d/c home with 3rd generation cephalosporin antibiotic therapy empirically as she has responded appropriately to ceftriaxone as inpatient.  Will arrange outpatient surgery to remove stones ureteroscopically with laser lithotripsy.  I discussed the potential benefits and risks of the procedure with her and her sister, side effects of the proposed treatment, the likelihood of the patient achieving the goals of the procedure, and any potential problems that might occur during the procedure or recuperation. They give informed consent.    LOS: 2 days   Dutch Gray 09/18/2019, 8:00 AM

## 2019-09-21 ENCOUNTER — Other Ambulatory Visit (HOSPITAL_COMMUNITY)
Admission: RE | Admit: 2019-09-21 | Discharge: 2019-09-21 | Disposition: A | Discharge status: Home / Self Care | Payer: Medicare Other | Source: Ambulatory Visit | Attending: Urology | Admitting: Urology

## 2019-09-21 ENCOUNTER — Other Ambulatory Visit: Payer: Self-pay

## 2019-09-21 ENCOUNTER — Encounter (HOSPITAL_COMMUNITY): Payer: Self-pay | Admitting: *Deleted

## 2019-09-21 DIAGNOSIS — Z01812 Encounter for preprocedural laboratory examination: Secondary | ICD-10-CM | POA: Insufficient documentation

## 2019-09-21 DIAGNOSIS — Z20828 Contact with and (suspected) exposure to other viral communicable diseases: Secondary | ICD-10-CM | POA: Diagnosis not present

## 2019-09-21 LAB — CULTURE, BLOOD (ROUTINE X 2)
Culture: NO GROWTH
Culture: NO GROWTH

## 2019-09-21 NOTE — Progress Notes (Signed)
PCP -  Dr Randall Hiss dean Cardiologist - Binnie Kand np 06-23-19 epic  Chest x-ray - 09-16-19 epic EKG - 09-16-19 epic Stress Test - 06-15-19 epic ECHO - 06-15-19 epic Cardiac Cath - none  Sleep Study - none CPAP -   Fasting Blood Sugar - 140 (checks after eating Checks Blood Sugar ___3__ times a day  Blood Thinner Instructions:none Aspirin Instructions:none Last Dose:  Anesthesia review: patient ok for same day per zessica zanetto pa  Patient denies shortness of breath, fever, cough and chest pain at PAT appointment   Patient verbalized understanding of instructions that were given to them at the PAT appointment. Patient was also instructed that they will need to review over the PAT instructions again at home before surgery.

## 2019-09-22 LAB — NOVEL CORONAVIRUS, NAA (HOSP ORDER, SEND-OUT TO REF LAB; TAT 18-24 HRS): SARS-CoV-2, NAA: NOT DETECTED

## 2019-09-24 ENCOUNTER — Ambulatory Visit (HOSPITAL_COMMUNITY): Payer: Medicare Other

## 2019-09-24 ENCOUNTER — Ambulatory Visit (HOSPITAL_COMMUNITY): Payer: Medicare Other | Admitting: Physician Assistant

## 2019-09-24 ENCOUNTER — Ambulatory Visit (HOSPITAL_COMMUNITY)
Admission: RE | Admit: 2019-09-24 | Discharge: 2019-09-24 | Disposition: A | Discharge status: Home / Self Care | Payer: Medicare Other | Attending: Urology | Admitting: Urology

## 2019-09-24 ENCOUNTER — Encounter (HOSPITAL_COMMUNITY): Payer: Self-pay | Admitting: Emergency Medicine

## 2019-09-24 ENCOUNTER — Other Ambulatory Visit: Payer: Self-pay

## 2019-09-24 ENCOUNTER — Encounter (HOSPITAL_COMMUNITY)
Admission: RE | Disposition: A | Discharge status: Home / Self Care | Payer: Self-pay | Source: Home / Self Care | Attending: Urology

## 2019-09-24 DIAGNOSIS — I10 Essential (primary) hypertension: Secondary | ICD-10-CM | POA: Diagnosis not present

## 2019-09-24 DIAGNOSIS — E119 Type 2 diabetes mellitus without complications: Secondary | ICD-10-CM | POA: Insufficient documentation

## 2019-09-24 DIAGNOSIS — K2100 Gastro-esophageal reflux disease with esophagitis, without bleeding: Secondary | ICD-10-CM | POA: Insufficient documentation

## 2019-09-24 DIAGNOSIS — Z794 Long term (current) use of insulin: Secondary | ICD-10-CM | POA: Insufficient documentation

## 2019-09-24 DIAGNOSIS — Z6830 Body mass index (BMI) 30.0-30.9, adult: Secondary | ICD-10-CM | POA: Insufficient documentation

## 2019-09-24 DIAGNOSIS — Z833 Family history of diabetes mellitus: Secondary | ICD-10-CM | POA: Diagnosis not present

## 2019-09-24 DIAGNOSIS — E785 Hyperlipidemia, unspecified: Secondary | ICD-10-CM | POA: Insufficient documentation

## 2019-09-24 DIAGNOSIS — N201 Calculus of ureter: Secondary | ICD-10-CM | POA: Diagnosis present

## 2019-09-24 DIAGNOSIS — Z87442 Personal history of urinary calculi: Secondary | ICD-10-CM | POA: Diagnosis not present

## 2019-09-24 DIAGNOSIS — E669 Obesity, unspecified: Secondary | ICD-10-CM | POA: Insufficient documentation

## 2019-09-24 DIAGNOSIS — E559 Vitamin D deficiency, unspecified: Secondary | ICD-10-CM | POA: Insufficient documentation

## 2019-09-24 DIAGNOSIS — Z79899 Other long term (current) drug therapy: Secondary | ICD-10-CM | POA: Diagnosis not present

## 2019-09-24 DIAGNOSIS — Z8249 Family history of ischemic heart disease and other diseases of the circulatory system: Secondary | ICD-10-CM | POA: Insufficient documentation

## 2019-09-24 HISTORY — DX: Calculus of ureter: N20.1

## 2019-09-24 HISTORY — PX: CYSTOSCOPY/URETEROSCOPY/HOLMIUM LASER/STENT PLACEMENT: SHX6546

## 2019-09-24 LAB — CBC
HCT: 36.1 % (ref 36.0–46.0)
Hemoglobin: 11.7 g/dL — ABNORMAL LOW (ref 12.0–15.0)
MCH: 28.3 pg (ref 26.0–34.0)
MCHC: 32.4 g/dL (ref 30.0–36.0)
MCV: 87.4 fL (ref 80.0–100.0)
Platelets: 390 10*3/uL (ref 150–400)
RBC: 4.13 MIL/uL (ref 3.87–5.11)
RDW: 12.7 % (ref 11.5–15.5)
WBC: 7.5 10*3/uL (ref 4.0–10.5)
nRBC: 0 % (ref 0.0–0.2)

## 2019-09-24 LAB — BASIC METABOLIC PANEL
Anion gap: 13 (ref 5–15)
BUN: 17 mg/dL (ref 8–23)
CO2: 24 mmol/L (ref 22–32)
Calcium: 9.6 mg/dL (ref 8.9–10.3)
Chloride: 104 mmol/L (ref 98–111)
Creatinine, Ser: 0.82 mg/dL (ref 0.44–1.00)
GFR calc Af Amer: >60 mL/min (ref >60)
GFR calc non Af Amer: >60 mL/min (ref >60)
Glucose, Bld: 158 mg/dL — ABNORMAL HIGH (ref 70–99)
Potassium: 3.5 mmol/L (ref 3.5–5.1)
Sodium: 141 mmol/L (ref 135–145)

## 2019-09-24 LAB — GLUCOSE, CAPILLARY
Glucose-Capillary: 145 mg/dL — ABNORMAL HIGH (ref 70–99)
Glucose-Capillary: 136 mg/dL — ABNORMAL HIGH (ref 70–99)

## 2019-09-24 SURGERY — CYSTOSCOPY/URETEROSCOPY/HOLMIUM LASER/STENT PLACEMENT
Anesthesia: General | Laterality: Right

## 2019-09-24 MED ORDER — SODIUM CHLORIDE 0.9 % IR SOLN
Status: DC | PRN
  Administered 2019-09-24: 6000 mL via INTRAVESICAL

## 2019-09-24 MED ORDER — LACTATED RINGERS IV SOLN
INTRAVENOUS | Status: DC
  Administered 2019-09-24: 12:00:00 via INTRAVENOUS

## 2019-09-24 MED ORDER — LIDOCAINE 2% (20 MG/ML) 5 ML SYRINGE
INTRAMUSCULAR | Status: DC | PRN
  Administered 2019-09-24: 75 mg via INTRAVENOUS

## 2019-09-24 MED ORDER — PROPOFOL 10 MG/ML IV BOLUS
INTRAVENOUS | Status: AC
  Filled 2019-09-24: qty 20

## 2019-09-24 MED ORDER — ONDANSETRON HCL 4 MG/2ML IJ SOLN: 4.0000 mg | Freq: Four times a day (QID) | INTRAMUSCULAR | Status: DC | PRN

## 2019-09-24 MED ORDER — SODIUM CHLORIDE 0.9 % IV SOLN
2.0000 g | Freq: Once | INTRAVENOUS | Status: AC
  Administered 2019-09-24: 2 g via INTRAVENOUS
  Filled 2019-09-24: qty 20

## 2019-09-24 MED ORDER — ONDANSETRON HCL 4 MG/2ML IJ SOLN
INTRAMUSCULAR | Status: DC | PRN
  Administered 2019-09-24: 4 mg via INTRAVENOUS

## 2019-09-24 MED ORDER — OXYCODONE HCL 5 MG PO TABS: 5.0000 mg | ORAL_TABLET | Freq: Once | ORAL | Status: DC | PRN

## 2019-09-24 MED ORDER — FENTANYL CITRATE (PF) 100 MCG/2ML IJ SOLN
INTRAMUSCULAR | Status: DC | PRN
  Administered 2019-09-24 (×4): 25 ug via INTRAVENOUS

## 2019-09-24 MED ORDER — DEXAMETHASONE SODIUM PHOSPHATE 10 MG/ML IJ SOLN
INTRAMUSCULAR | Status: AC
  Filled 2019-09-24: qty 1

## 2019-09-24 MED ORDER — LIDOCAINE 2% (20 MG/ML) 5 ML SYRINGE
INTRAMUSCULAR | Status: AC
  Filled 2019-09-24: qty 5

## 2019-09-24 MED ORDER — OXYCODONE HCL 5 MG/5ML PO SOLN: 5.0000 mg | Freq: Once | ORAL | Status: DC | PRN

## 2019-09-24 MED ORDER — ONDANSETRON HCL 4 MG/2ML IJ SOLN
INTRAMUSCULAR | Status: AC
  Filled 2019-09-24: qty 2

## 2019-09-24 MED ORDER — DEXAMETHASONE SODIUM PHOSPHATE 10 MG/ML IJ SOLN
INTRAMUSCULAR | Status: DC | PRN
  Administered 2019-09-24: 10 mg via INTRAVENOUS

## 2019-09-24 MED ORDER — FENTANYL CITRATE (PF) 100 MCG/2ML IJ SOLN: 25.0000 ug | INTRAMUSCULAR | Status: DC | PRN

## 2019-09-24 MED ORDER — FENTANYL CITRATE (PF) 100 MCG/2ML IJ SOLN
INTRAMUSCULAR | Status: AC
  Filled 2019-09-24: qty 2

## 2019-09-24 MED ORDER — PROPOFOL 10 MG/ML IV BOLUS
INTRAVENOUS | Status: DC | PRN
  Administered 2019-09-24: 170 mg via INTRAVENOUS
  Administered 2019-09-24: 30 mg via INTRAVENOUS

## 2019-09-24 SURGICAL SUPPLY — 23 items
BAG URO CATCHER STRL LF (MISCELLANEOUS) ×3 IMPLANT
BASKET ZERO TIP NITINOL 2.4FR (BASKET) IMPLANT
BSKT STON RTRVL ZERO TP 2.4FR (BASKET)
CATH INTERMIT  6FR 70CM (CATHETERS) ×2 IMPLANT
CLOTH BEACON ORANGE TIMEOUT ST (SAFETY) ×3 IMPLANT
COVER WAND RF STERILE (DRAPES) IMPLANT
EXTRACTOR STONE NITINOL NGAGE (UROLOGICAL SUPPLIES) ×2 IMPLANT
FIBER LASER FLEXIVA 365 (UROLOGICAL SUPPLIES) IMPLANT
FIBER LASER TRAC TIP (UROLOGICAL SUPPLIES) IMPLANT
GLOVE BIOGEL M STRL SZ7.5 (GLOVE) ×7 IMPLANT
GOWN STRL REUS W/TWL LRG LVL3 (GOWN DISPOSABLE) ×6 IMPLANT
GUIDEWIRE ANG ZIPWIRE 038X150 (WIRE) IMPLANT
GUIDEWIRE STR DUAL SENSOR (WIRE) ×3 IMPLANT
IV NS 1000ML (IV SOLUTION) ×3
IV NS 1000ML BAXH (IV SOLUTION) ×1 IMPLANT
KIT TURNOVER KIT A (KITS) ×2 IMPLANT
MANIFOLD NEPTUNE II (INSTRUMENTS) ×3 IMPLANT
PACK CYSTO (CUSTOM PROCEDURE TRAY) ×3 IMPLANT
SHEATH URETERAL 12FRX35CM (MISCELLANEOUS) IMPLANT
STENT CONTOUR 6FRX24X.038 (STENTS) ×2 IMPLANT
TUBING CONNECTING 10 (TUBING) ×2 IMPLANT
TUBING CONNECTING 10' (TUBING) ×1
TUBING UROLOGY SET (TUBING) ×3 IMPLANT

## 2019-09-24 NOTE — Anesthesia Preprocedure Evaluation (Signed)
Anesthesia Evaluation  Patient identified by MRN, date of birth, ID band Patient awake    Reviewed: Allergy & Precautions, H&P , NPO status , Patient's Chart, lab work & pertinent test results  Airway Mallampati: II   Neck ROM: full    Dental   Pulmonary neg pulmonary ROS,    breath sounds clear to auscultation       Cardiovascular hypertension,  Rhythm:regular Rate:Normal     Neuro/Psych    GI/Hepatic   Endo/Other  diabetes, Type 2  Renal/GU Renal InsufficiencyRenal disease     Musculoskeletal   Abdominal   Peds  Hematology   Anesthesia Other Findings   Reproductive/Obstetrics                             Anesthesia Physical Anesthesia Plan  ASA: II  Anesthesia Plan: General   Post-op Pain Management:    Induction: Intravenous  PONV Risk Score and Plan: 3 and Ondansetron, Dexamethasone, Midazolam and Treatment may vary due to age or medical condition  Airway Management Planned: LMA  Additional Equipment:   Intra-op Plan:   Post-operative Plan:   Informed Consent: I have reviewed the patients History and Physical, chart, labs and discussed the procedure including the risks, benefits and alternatives for the proposed anesthesia with the patient or authorized representative who has indicated his/her understanding and acceptance.       Plan Discussed with: CRNA, Anesthesiologist and Surgeon  Anesthesia Plan Comments:         Anesthesia Quick Evaluation

## 2019-09-24 NOTE — Transfer of Care (Signed)
Immediate Anesthesia Transfer of Care Note  Patient: [name]  Procedure(s) Performed: CYSTOSCOPY/URETEROSCOPY/HOLMIUM LASER/STENT EXCHANGE (Right )  Patient Location: PACU  Anesthesia Type:General  Level of Consciousness: awake, sedated, patient cooperative and responds to stimulation  Airway & Oxygen Therapy: Patient Spontanous Breathing and Patient connected to face mask oxygen  Post-op Assessment: Report given to RN and Post -op Vital signs reviewed and stable  Post vital signs: Reviewed and stable  Last Vitals:  Vitals Value Taken Time  BP 124/74 09/24/19 1431  Temp    Pulse 92 09/24/19 1432  Resp 17 09/24/19 1432  SpO2 100 % 09/24/19 1432  Vitals shown include unvalidated device data.  Last Pain:  Vitals:   09/24/19 1210  TempSrc:   PainSc: 0-No pain      Patients Stated Pain Goal: 4 (71/24/58 0998)  Complications: No apparent anesthesia complications

## 2019-09-24 NOTE — Anesthesia Postprocedure Evaluation (Signed)
Anesthesia Post Note  Patient: [name]  Procedure(s) Performed: CYSTOSCOPY/URETEROSCOPY/HOLMIUM LASER/STENT EXCHANGE (Right )     Patient location during evaluation: PACU Anesthesia Type: General Level of consciousness: awake and alert Pain management: pain level controlled Vital Signs Assessment: post-procedure vital signs reviewed and stable Respiratory status: spontaneous breathing, nonlabored ventilation, respiratory function stable and patient connected to nasal cannula oxygen Cardiovascular status: blood pressure returned to baseline and stable Postop Assessment: no apparent nausea or vomiting Anesthetic complications: no    Last Vitals:  Vitals:   09/24/19 1458 09/24/19 1526  BP: (!) 141/75 (!) 147/81  Pulse: 92 87  Resp: (!) 22 18  Temp: 36.5 C 36.6 C  SpO2: 94% 96%    Last Pain:  Vitals:   09/24/19 1526  TempSrc: Oral  PainSc: 0-No pain                 Arika Mainer

## 2019-09-24 NOTE — Anesthesia Procedure Notes (Signed)
Procedure Name: LMA Insertion Date/Time: 09/24/2019 1:41 PM Performed by: Lollie Sails, CRNA Pre-anesthesia Checklist: Patient identified, Emergency Drugs available, Suction available, Patient being monitored and Timeout performed Patient Re-evaluated:Patient Re-evaluated prior to induction Oxygen Delivery Method: Circle system utilized Preoxygenation: Pre-oxygenation with 100% oxygen Induction Type: IV induction Ventilation: Mask ventilation without difficulty LMA: LMA inserted LMA Size: 4.0 Number of attempts: 1 Placement Confirmation: positive ETCO2 and breath sounds checked- equal and bilateral Tube secured with: Tape Dental Injury: Teeth and Oropharynx as per pre-operative assessment

## 2019-09-24 NOTE — Op Note (Signed)
Preoperative diagnosis: Right ureteral calculus  Postoperative diagnosis: Right ureteral calculus  Procedure:  1. Cystoscopy 2. Right ureteroscopy and stone removal 3. Ureteroscopic laser lithotripsy 4. Right ureteral stent placement (6 x 24)  Surgeon: Roxy Horseman, Brooke Bonito. M.D.  Anesthesia: General  Complications: None  EBL: Minimal  Specimens: 1. Right ureteral calculus  Disposition of specimens: [facility] for stone analysis  Indication: [name] is a 63 y.o. year old patient with urolithiasis.  She recently presented with obstructing right ureteral calculi and infection and is s/p stent.  She has been treated with antibiotics and presents for definitive stone treatment today. After reviewing the management options for treatment, the patient elected to proceed with the above surgical procedure(s). We have discussed the potential benefits and risks of the procedure, side effects of the proposed treatment, the likelihood of the patient achieving the goals of the procedure, and any potential problems that might occur during the procedure or recuperation. Informed consent has been obtained.  Description of procedure:  The patient was taken to the operating room and general anesthesia was induced.  The patient was placed in the dorsal lithotomy position, prepped and draped in the usual sterile fashion, and preoperative antibiotics were administered. A preoperative time-out was performed.   Cystourethroscopy was performed.  The patient's urethra was examined and was normal. The bladder was then systematically examined in its entirety. There was no evidence for any bladder tumors, stones, or other mucosal pathology.    Attention then turned to the right ureteral orifice and the indwelling stent was identified and brought to the urethral meatus with graspers.    A 0.38 sensor guidewire was then advanced up the right ureter into the renal pelvis under fluoroscopic  guidance. The 6 Fr semirigid ureteroscope was then advanced into the ureter next to the guidewire and the calculus was identified.   The stone was then fragmented with the 365 micron holmium laser fiber on a setting of 0.6J and frequency of 6 Hz.   All stones were then removed from the ureter with a zero tip nitinol basket.  Reinspection of the ureter revealed no remaining visible stones or fragments.   The wire was then backloaded through the cystoscope and a ureteral stent was advance over the wire using Seldinger technique.  The stent was positioned appropriately under fluoroscopic and cystoscopic guidance.  The wire was then removed with an adequate stent curl noted in the renal pelvis as well as in the bladder.  The bladder was then emptied and the procedure ended.  The patient appeared to tolerate the procedure well and without complications.  The patient was able to be awakened and transferred to the recovery unit in satisfactory condition.

## 2019-09-24 NOTE — Interval H&P Note (Signed)
History and Physical Interval Note:  09/24/2019 11:52 AM  [name]  has presented today for surgery, with the diagnosis of RIGHT URETERAL CALCULI.  The various methods of treatment have been discussed with the patient and family. After consideration of risks, benefits and other options for treatment, the patient has consented to  Procedure(s): CYSTOSCOPY/URETEROSCOPY/HOLMIUM LASER/STENT PLACEMENT (Right) as a surgical intervention.  The patient's history has been reviewed, patient examined, no change in status, stable for surgery.  I have reviewed the patient's chart and labs.  Questions were answered to the patient's satisfaction.     [employer]

## 2019-09-24 NOTE — Discharge Instructions (Signed)

## 2019-09-25 ENCOUNTER — Encounter (HOSPITAL_COMMUNITY): Payer: Self-pay | Admitting: Urology

## 2019-12-21 ENCOUNTER — Other Ambulatory Visit (HOSPITAL_COMMUNITY): Payer: Self-pay | Admitting: Nurse Practitioner

## 2019-12-21 DIAGNOSIS — N130 Hydronephrosis with ureteropelvic junction obstruction: Secondary | ICD-10-CM

## 2020-01-04 ENCOUNTER — Other Ambulatory Visit: Payer: Self-pay

## 2020-01-04 ENCOUNTER — Encounter (HOSPITAL_COMMUNITY)
Admission: RE | Admit: 2020-01-04 | Discharge: 2020-01-04 | Disposition: A | Discharge status: Home / Self Care | Payer: Medicare Other | Source: Ambulatory Visit | Attending: Nurse Practitioner | Admitting: Nurse Practitioner

## 2020-01-04 DIAGNOSIS — N130 Hydronephrosis with ureteropelvic junction obstruction: Secondary | ICD-10-CM | POA: Diagnosis not present

## 2020-01-04 MED ORDER — TECHNETIUM TC 99M MERTIATIDE: 5.0000 | Freq: Once | INTRAVENOUS | Status: DC | PRN

## 2020-01-04 MED ORDER — FUROSEMIDE 10 MG/ML IJ SOLN
40.0000 mg | Freq: Once | INTRAMUSCULAR | Status: AC
  Administered 2020-01-04: 40 mg via INTRAVENOUS

## 2020-01-04 MED ORDER — FUROSEMIDE 10 MG/ML IJ SOLN
INTRAMUSCULAR | Status: AC
  Filled 2020-01-04: qty 4

## 2020-01-10 ENCOUNTER — Ambulatory Visit: Payer: Medicare Other | Attending: Internal Medicine

## 2020-01-10 DIAGNOSIS — Z23 Encounter for immunization: Secondary | ICD-10-CM | POA: Insufficient documentation

## 2020-01-11 NOTE — Progress Notes (Signed)
   Covid-19 Vaccination Clinic  Name:  [name]    MRN: [id number] DOB: 08/02/1956  01/10/2020  [name] was observed post Covid-19 immunization for 15 minutes without incidence. She was provided with Vaccine Information Sheet and instruction to access the V-Safe system.   [name] was instructed to call 911 with any severe reactions post vaccine: Marland Kitchen Difficulty breathing  . Swelling of your face and throat  . A fast heartbeat  . A bad rash all over your body  . Dizziness and weakness    Immunizations Administered    Name Date Dose VIS Date Route   Moderna COVID-19 Vaccine 01/10/2020  2:11 PM 0.5 mL 11/17/2019 Intramuscular   Manufacturer: Gala Murdoch   Lot: [id number]   NDC: 95583-167-42      Documented on behalf of: K. Roxan Hockey

## 2020-02-07 ENCOUNTER — Ambulatory Visit: Payer: Medicare Other | Attending: Internal Medicine

## 2020-02-07 DIAGNOSIS — Z23 Encounter for immunization: Secondary | ICD-10-CM

## 2020-02-07 NOTE — Progress Notes (Signed)
   Covid-19 Vaccination Clinic  Name:  [name]    MRN: [id number] DOB: 01-Mar-1956  02/07/2020  [name] was observed post Covid-19 immunization for 15 minutes without incidence. She was provided with Vaccine Information Sheet and instruction to access the V-Safe system.   [name] was instructed to call 911 with any severe reactions post vaccine: Marland Kitchen Difficulty breathing  . Swelling of your face and throat  . A fast heartbeat  . A bad rash all over your body  . Dizziness and weakness    Immunizations Administered    Name Date Dose VIS Date Route   Moderna COVID-19 Vaccine 02/07/2020  1:45 PM 0.5 mL 11/17/2019 Intramuscular   Manufacturer: Moderna   Lot: [id number]   NDC: 49611-643-53

## 2020-02-16 ENCOUNTER — Ambulatory Visit: Payer: Medicare Other | Admitting: Cardiology

## 2020-02-16 ENCOUNTER — Other Ambulatory Visit: Payer: Self-pay

## 2020-02-16 VITALS — BP 136/69 | HR 94 | Temp 96.6°F | Ht 64.0 in | Wt 192.0 lb

## 2020-02-16 DIAGNOSIS — E7800 Pure hypercholesterolemia, unspecified: Secondary | ICD-10-CM

## 2020-02-16 DIAGNOSIS — I10 Essential (primary) hypertension: Secondary | ICD-10-CM

## 2020-02-16 DIAGNOSIS — E119 Type 2 diabetes mellitus without complications: Secondary | ICD-10-CM

## 2020-02-16 NOTE — Progress Notes (Signed)
Primary Physician:  Rogers Blocker, MD   Patient ID: [name], female    DOB: June 03, 1956, 64 y.o.   MRN: [id number]  Subjective:    Chief Complaint  Patient presents with  . Hypertension    follow up  . Tachycardia     HPI: [name]  is a 65 y.o. female  with hypertension, uncontrolled diabetes, vitamin D deficiency, and abnormal EKG, here for 6 month follow up.   She underwent treadmill stress test on 05/04/19 that was without EKG changes, but did note uncontrolled hypertension.  She underwent echocardiogram on 06/15/2019 revealing hyperdynamic LV with normal LVEF.  She was noted to be tachycardic throughout the study. Due to persistent tachycardia, verapamil was changed to metoprolol.   Patient is here for follow-up today accompanied by her sister.  States that she is doing well, sister reports that she has been without complaints of chest pain or palpitations.  She has been walking their dog daily in their large backyard and tolerates this well.  They have been monitoring her blood pressure which has been well controlled.  She is tolerating medications well.  Denies any former tobacco use. Has family history positive for heart disease in her mother at age 66.   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   Past Medical History:  Diagnosis Date  . Diabetes mellitus   . DM (diabetes mellitus) ([place]) 07/22/2019  . HLD (hyperlipidemia) 07/22/2019  . HTN (hypertension) 07/22/2019  . Hyperlipidemia   . Hypertension   . Obesity, unspecified   . Reflux esophagitis   . Right ureteral stone   . Type 2 diabetes mellitus without complication, without long-term current use of insulin ([place]) 07/22/2019  . Unspecified vitamin D deficiency     Past Surgical History:  Procedure Laterality Date  . CHOLECYSTECTOMY    . CYSTOSCOPY W/ URETERAL STENT PLACEMENT Right 09/16/2019   Procedure: CYSTOSCOPY WITH RETROGRADE PYELOGRAM/URETERAL STENT PLACEMENT;  Surgeon:  Raynelle Bring, MD;  Location: [facility];  Service: Urology;  Laterality: Right;  . CYSTOSCOPY/URETEROSCOPY/HOLMIUM LASER/STENT PLACEMENT Right 09/24/2019   Procedure: CYSTOSCOPY/URETEROSCOPY/HOLMIUM LASER/STENT EXCHANGE;  Surgeon: Raynelle Bring, MD;  Location: [facility];  Service: Urology;  Laterality: Right;    Social History   Tobacco Use  . Smoking status: Never Smoker  . Smokeless tobacco: Never Used  Substance Use Topics  . Alcohol use: No    Review of Systems  Cardiovascular: Negative for chest pain, claudication, dyspnea on exertion, leg swelling, orthopnea, palpitations and syncope.  Musculoskeletal: Positive for arthritis.  Neurological: Negative for dizziness, focal weakness and headaches.  All other systems reviewed and are negative.     Objective:  Blood pressure 136/69, pulse 94, temperature (!) 96.6 F (35.9 C), height '5\' 4"'  (1.626 m), weight 192 lb (87.1 kg), SpO2 96 %. Body mass index is 32.96 kg/m.     Physical Exam  Constitutional: She is oriented to person, place, and time. Vital signs are normal. She appears well-developed and well-nourished.  HENT:  Head: Normocephalic and atraumatic.  Cardiovascular: Normal rate, regular rhythm, normal heart sounds and intact distal pulses.  Pulses:      Dorsalis pedis pulses are 2+ on the right side and 2+ on the left side.       Posterior tibial pulses are 2+ on the right side and 2+ on the left side.  Pulmonary/Chest: Effort normal and breath sounds normal. No accessory muscle usage. No respiratory distress.  Abdominal: Soft. Bowel sounds  are normal.  Musculoskeletal:        General: Normal range of motion.     Cervical back: Normal range of motion.  Neurological: She is alert and oriented to person, place, and time.  Skin: Skin is warm and dry.  Vitals reviewed.  Radiology: No results found.  Laboratory examination:   PCP labs 01/02/2019: Glucose 247, creatinine 0.9, EGFR 77, potassium 4.1, CMP normal.  Vitamin D  low at 27.9.  Hemoglobin A1c 11.6%.  CMP Latest Ref Rng & Units 09/24/2019 09/17/2019 09/16/2019  Glucose 70 - 99 mg/dL 158(H) 250(H) 283(H)  BUN 8 - 23 mg/dL 17 26(H) 31(H)  Creatinine 0.44 - 1.00 mg/dL 0.82 1.29(H) 1.71(H)  Sodium 135 - 145 mmol/L 141 137 134(L)  Potassium 3.5 - 5.1 mmol/L 3.5 4.0 3.9  Chloride 98 - 111 mmol/L 104 108 102  CO2 22 - 32 mmol/L 24 21(L) 21(L)  Calcium 8.9 - 10.3 mg/dL 9.6 8.4(L) 8.9  Total Protein 6.5 - 8.1 g/dL - - 7.3  Total Bilirubin 0.3 - 1.2 mg/dL - - 1.3(H)  Alkaline Phos 38 - 126 U/L - - 71  AST 15 - 41 U/L - - 18  ALT 0 - 44 U/L - - 17   CBC Latest Ref Rng & Units 09/24/2019 09/17/2019 09/16/2019  WBC 4.0 - 10.5 K/uL 7.5 9.8 11.5(H)  Hemoglobin 12.0 - 15.0 g/dL 11.7(L) 12.2 12.7  Hematocrit 36.0 - 46.0 % 36.1 37.9 38.5  Platelets 150 - 400 K/uL 390 204 219   Lipid Panel  No results found for: CHOL, TRIG, HDL, CHOLHDL, VLDL, LDLCALC, LDLDIRECT HEMOGLOBIN A1C Lab Results  Component Value Date   HGBA1C 11.1 (H) 09/17/2019   MPG 271.87 09/17/2019   TSH No results for input(s): TSH in the last 8760 hours.  PRN Meds:. Medications Discontinued During This Encounter  Medication Reason  . ondansetron (ZOFRAN ODT) 4 MG disintegrating tablet Error  . tamsulosin (FLOMAX) 0.4 MG CAPS capsule Error  . cefdinir (OMNICEF) 300 MG capsule Error   Current Meds  Medication Sig  . benazepril (LOTENSIN) 20 MG tablet Take 20 mg by mouth daily.   . chlorthalidone (HYGROTON) 25 MG tablet Take 25 mg by mouth daily.   . cholecalciferol (VITAMIN D) 1000 units tablet Take 1,000 Units by mouth daily.  Marland Kitchen LEVEMIR FLEXTOUCH 100 UNIT/ML Pen Inject 20-26 Units as directed 2 (two) times a day.   . metFORMIN (GLUCOPHAGE) 1000 MG tablet Take 1,000 mg by mouth 2 (two) times daily with a meal.   . metoprolol succinate (TOPROL-XL) 25 MG 24 hr tablet TAKE 1 TABLET(25 MG) BY MOUTH DAILY (Patient taking differently: Take 25 mg by mouth at bedtime. TAKE 1 TABLET(25 MG) BY MOUTH  DAILY)  . omeprazole (PRILOSEC) 40 MG capsule Take 40 mg by mouth daily as needed (indigestion).  . Potassium Chloride ER 20 MEQ TBCR Take 20 mEq by mouth daily.  . rosuvastatin (CRESTOR) 5 MG tablet Take 5 mg by mouth at bedtime.   . triamterene-hydrochlorothiazide (MAXZIDE-25) 37.5-25 MG per tablet Take 1 tablet by mouth daily.    Cardiac Studies:   Exercise treadmill stress test 04/27/2019:   Indication: Abnormal EKG, palpitations  The patient exercised on Bruce protocol for: 59 minutes.  Patient achieved 4.64 METs and reached heart rate 171 bpm, which is 109% of maximum age-predicted heart rate.  Stress test terminated due to fatigue.  Exercise capacity was below average for age. Heart rate response to exercise: Appropriate Blood pressure response to exercise: Resting  hypertension 150/91 mmHg-exaggerated response with peak exercise blood pressure 220/100 mmHg. Chest pain: None.  Arrhythmias: None. Resting EKG demonstrates normal sinus rhythm, left axis deviation. ST changes: With peak exercise there was no ST-T changes of ischemia.  Overall impression: Normal stress test with low exercise capacity. Recommendations: Continue primary/secondary prevention, especially blood pressure control.  Echocardiogram 06/15/2019: Hyperdynamic LV systolic function with visual EF 65-70%. Patient is mildly tachycardic throughout the study.  Left ventricle cavity is normal in size. Moderate concentric hypertrophy of the left ventricle. Normal global wall motion. Doppler evidence of grade I (impaired) diastolic dysfunction, normal LAP.  Mild tricuspid regurgitation. Estimated pulmonary artery systolic pressure is 29 mmHg.  Assessment:     ICD-10-CM   1. Primary hypertension  I10 EKG 12-Lead  2. Pure hypercholesterolemia  E78.00   3. Type 2 diabetes mellitus without complication, without long-term current use of insulin (H[facility])  E11.9    EKG 02/16/2020: Normal sinus rhythm at 90 bpm, left axis  deviation, PRWP cannot exclude anterior infarct old. No evidence of ischemia. No changes from EKG 07/22/2019 Recommendations:   [name]  is a 64 y.o. female  with hypertension, uncontrolled diabetes, vitamin D deficiency, and abnormal EKG, here for 6 month follow up.   Patient is doing well without any complaints today.  She has not had any further chest pain or palpitation episodes.  Her blood pressure has been well controlled with present medications, we will continue the same.  I have encouraged her to continue with risk factor modification including controlling her diabetes, hypertension, hyperlipidemia, and her weight.  Encouraged him to continue with regular exercise.  Hyperlipidemia and diabetes is being managed by PCP.  As her cardiac work-up has been unyielding and she has had improvement in her blood pressure and symptoms, we will see her back on a as needed basis, but encouraged him to contact me sooner if needed.  Will request that Dr. Marlou Sa take over prescribing her antihypertensive therapy.   Miquel Dunn, MSN, APRN, FNP-C [facility]. [place] Office: [phone] Fax: [phone]

## 2020-02-26 ENCOUNTER — Encounter: Payer: Self-pay | Admitting: Neurology

## 2020-02-26 ENCOUNTER — Other Ambulatory Visit: Payer: Self-pay

## 2020-02-26 DIAGNOSIS — M79644 Pain in right finger(s): Secondary | ICD-10-CM

## 2020-03-01 ENCOUNTER — Encounter: Payer: Medicare Other | Admitting: Neurology

## 2020-03-09 ENCOUNTER — Ambulatory Visit (INDEPENDENT_AMBULATORY_CARE_PROVIDER_SITE_OTHER): Payer: Medicare Other | Admitting: Neurology

## 2020-03-09 DIAGNOSIS — M79644 Pain in right finger(s): Secondary | ICD-10-CM

## 2020-03-09 DIAGNOSIS — G5603 Carpal tunnel syndrome, bilateral upper limbs: Secondary | ICD-10-CM

## 2020-03-09 DIAGNOSIS — G5623 Lesion of ulnar nerve, bilateral upper limbs: Secondary | ICD-10-CM

## 2020-03-09 NOTE — Procedures (Signed)
[facility]  [address]  [place], [place] [address] Tel: [phone] Fax:  [phone] Test Date:  03/09/2020  Patient: [name] DOB: 05-May-1956 Physician: Narda Amber, DO  Sex: Female Height: 5\' 5"  Ref Phys: Roseanne Kaufman  ID#: [id number] Temp: 33.0C Technician:    Patient Complaints: This is a 64 year old female referred for evaluation of hand paresthesias, worse on the right, concerning for ulnar neuropathy.  NCV & EMG Findings: Extensive electrodiagnostic testing of the right upper extremity and additional studies of the left shows: 1. Bilateral median and ulnar sensory responses are absent.  Bilateral radial sensory responses are within normal limits. 2. Bilateral median motor responses show prolonged latency (R4.8, L5.1 ms) and normal amplitude.  Bilateral ulnar motor responses show prolonged latency (R4.6, R5.3, L4.5 ms), reduced amplitude which is severe on the right (R0.6, 0.5, L3.1 ms), and severely slowed conduction velocity across the elbow (24 m/s) and right forearm (44 m/s). 3. Active on chronic motor axonal loss changes are seen affecting the ulnar innervated muscles on the right there is associated muscle atrophy.  Chronic motor axonal loss changes are seen affecting the ulnar innervated muscles on the left.   Impression: 1. 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 2. Left ulnar neuropathy with slowing across the elbow, demyelinating and axonal, severe. 3. Bilateral median neuropathy at or distal to the wrist (severe), consistent with a clinical diagnosis of carpal tunnel syndrome.     ___________________________ Narda Amber, DO    Nerve Conduction Studies Anti Sensory Summary Table   Stim Site NR Peak (ms) Norm Peak (ms) P-T Amp (V) Norm P-T Amp  Left Median Anti Sensory (2nd Digit)  33C  Wrist NR  <3.8  >10  Right Median Anti Sensory (2nd Digit)  33C  Wrist NR  <3.8  >10  Left  Radial Anti Sensory (Base 1st Digit)  33C  Wrist    2.4 <2.8 22.2 >10  Right Radial Anti Sensory (Base 1st Digit)  33C  Wrist    2.3 <2.8 27.9 >10  Left Ulnar Anti Sensory (5th Digit)  33C  Wrist NR  <3.2  >5  Right Ulnar Anti Sensory (5th Digit)  33C  Wrist NR  <3.2  >5   Motor Summary Table   Stim Site NR Onset (ms) Norm Onset (ms) O-P Amp (mV) Norm O-P Amp Site1 Site2 Delta-0 (ms) Dist (cm) Vel (m/s) Norm Vel (m/s)  Left Median Motor (Abd Poll Brev)  33C  Wrist    5.1 <4.0 6.2 >5 Elbow Wrist 5.3 27.0 51 >50  Elbow    10.4  6.7         Right Median Motor (Abd Poll Brev)  33C  Wrist    4.8 <4.0 6.7 >5 Elbow Wrist 5.3 27.0 51 >50  Elbow    10.1  6.0         Left Ulnar Motor (Abd Dig Minimi)  33C  Wrist    4.1 <3.1 3.1 >7 B Elbow Wrist 3.5 22.0 63 >50  B Elbow    7.6  2.8  A Elbow B Elbow 4.1 10.0 24 >50  A Elbow    11.7  2.1         Right Ulnar Motor (Abd Dig Minimi)  33C  Wrist    4.5 <3.1 0.6 >7 B Elbow Wrist 5.2 23.0 44 >50  B Elbow    9.7  0.4  A Elbow B Elbow  4.1 10.0 24 >50  A Elbow    13.8  0.3         Right Ulnar (FDI) Motor (1st DI)  33C  Wrist    5.3 <4.5 0.5 >7 B Elbow Wrist  0.0  >50  B Elbow NR     A Elbow B Elbow  0.0  >50  A Elbow NR             EMG   Side Muscle Ins Act Fibs Psw Fasc Number Recrt Dur Dur. Amp Amp. Poly Poly. Comment  Right 1stDorInt Nml 2+ Nml Nml SMU Rapid All 1+ All 1+ All 1+ ATR  Right Abd Poll Brev Nml Nml Nml Nml Nml Nml Nml Nml Nml Nml Nml Nml N/A  Right Ext Indicis Nml Nml Nml Nml Nml Nml Nml Nml Nml Nml Nml Nml N/A  Right PronatorTeres Nml Nml Nml Nml Nml Nml Nml Nml Nml Nml Nml Nml N/A  Right Biceps Nml Nml Nml Nml Nml Nml Nml Nml Nml Nml Nml Nml N/A  Right Triceps Nml Nml Nml Nml Nml Nml Nml Nml Nml Nml Nml Nml N/A  Right Deltoid Nml Nml Nml Nml Nml Nml Nml Nml Nml Nml Nml Nml N/A  Right ABD Dig Min Nml 1+ Nml Nml SMU Rapid All 1+ All 1+ All 1+ ATR  Right FlexDigProf 4,5 Nml Nml Nml Nml 1- Rapid Some 1+ Some 1+ Nml Nml N/A    Left 1stDorInt Nml Nml Nml Nml 2- Rapid Some 1+ Some 1+ Nml Nml N/A  Left Abd Poll Brev Nml Nml Nml Nml Nml Nml Nml Nml Nml Nml Nml Nml N/A  Left Ext Indicis Nml Nml Nml Nml Nml Nml Nml Nml Nml Nml Nml Nml N/A  Left PronatorTeres Nml Nml Nml Nml Nml Nml Nml Nml Nml Nml Nml Nml N/A  Left ABD Dig Min Nml Nml Nml Nml 2- Rapid Some 1+ Some 1+ Nml Nml N/A  Left FlexCarpiUln Nml Nml Nml Nml 1- Rapid Some 1+ Some 1+ Nml Nml N/A      Waveforms:

## 2020-03-29 ENCOUNTER — Encounter: Payer: Medicare Other | Admitting: Neurology

## 2020-07-18 ENCOUNTER — Other Ambulatory Visit: Payer: Self-pay | Admitting: Cardiology

## 2020-11-14 IMAGING — CR DG CHEST 2V
1 series · 1 of 1 positions shown · non-contrast
Comparison: None.

CLINICAL DATA: Sepsis.

EXAM:
CHEST - 2 VIEW

[w chest pa]
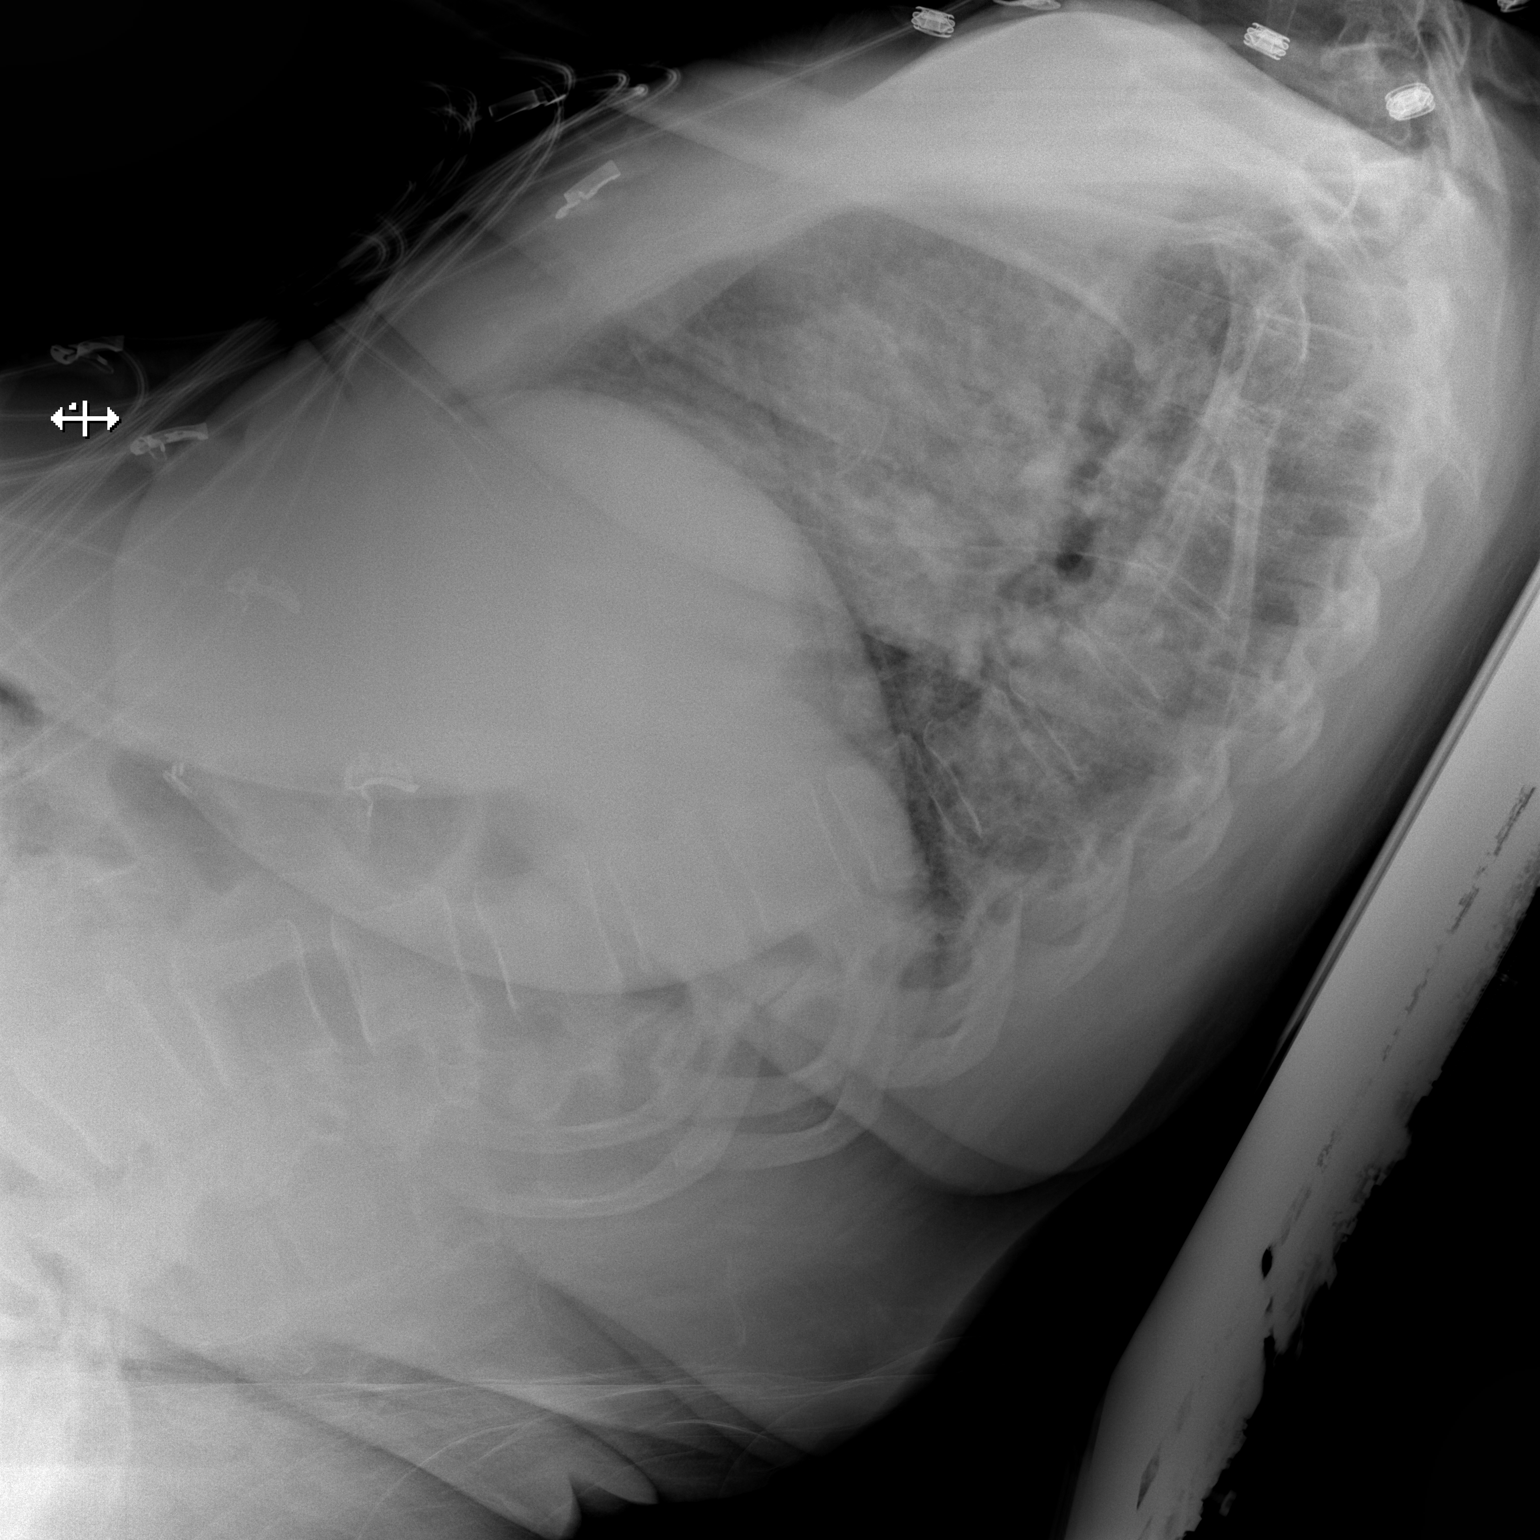

[1 of 1 positions shown; findings below may reference images not displayed]

FINDINGS: The heart size and mediastinal contours are within normal limits.
Both lungs are clear. The visualized skeletal structures are
unremarkable.
IMPRESSION: No active cardiopulmonary disease.

## 2020-11-15 ENCOUNTER — Encounter (HOSPITAL_COMMUNITY): Payer: Self-pay

## 2020-11-15 ENCOUNTER — Other Ambulatory Visit: Payer: Self-pay

## 2020-11-15 ENCOUNTER — Emergency Department (HOSPITAL_COMMUNITY)
Admission: EM | Admit: 2020-11-15 | Discharge: 2020-11-15 | Disposition: A | Discharge status: Home / Self Care | Payer: Medicare Other | Attending: Emergency Medicine | Admitting: Emergency Medicine

## 2020-11-15 DIAGNOSIS — Z79899 Other long term (current) drug therapy: Secondary | ICD-10-CM | POA: Diagnosis not present

## 2020-11-15 DIAGNOSIS — Z7984 Long term (current) use of oral hypoglycemic drugs: Secondary | ICD-10-CM | POA: Diagnosis not present

## 2020-11-15 DIAGNOSIS — H5711 Ocular pain, right eye: Secondary | ICD-10-CM | POA: Diagnosis present

## 2020-11-15 DIAGNOSIS — E119 Type 2 diabetes mellitus without complications: Secondary | ICD-10-CM | POA: Diagnosis not present

## 2020-11-15 DIAGNOSIS — I10 Essential (primary) hypertension: Secondary | ICD-10-CM | POA: Diagnosis not present

## 2020-11-15 LAB — CBG MONITORING, ED: Glucose-Capillary: 279 mg/dL — ABNORMAL HIGH (ref 70–99)

## 2020-11-15 MED ORDER — FLUORESCEIN SODIUM 1 MG OP STRP
1.0000 | ORAL_STRIP | Freq: Once | OPHTHALMIC | Status: DC
  Filled 2020-11-15: qty 1

## 2020-11-15 MED ORDER — TETRACAINE HCL 0.5 % OP SOLN
2.0000 [drp] | Freq: Once | OPHTHALMIC | Status: AC
  Administered 2020-11-15: 2 [drp] via OPHTHALMIC
  Filled 2020-11-15: qty 4

## 2020-11-15 NOTE — ED Provider Notes (Signed)
[name] Provider Note   CSN: [id number] Arrival date & time: 11/15/20  0207   History Chief Complaint  Patient presents with  . Eye Problem    [name] is a 64 y.o. female.  The history is provided by the patient and a relative.  Eye Problem She has history of hypertension, diabetes, hyperlipidemia and comes in complaining of right eye pain which started last night.  She had cataract surgery on that eye on 11/17 and actually had cataract surgery on the other eye on 11/24.  She was doing fine until last night.  She is not noticed any change in her vision.  There has been some tearing from the eye.  She denies fever or chills.  She denies any trauma.  She has been compliant with her post surgery eye care.  Past Medical History:  Diagnosis Date  . Diabetes mellitus   . DM (diabetes mellitus) ([facility]) 07/22/2019  . HLD (hyperlipidemia) 07/22/2019  . HTN (hypertension) 07/22/2019  . Hyperlipidemia   . Hypertension   . Obesity, unspecified   . Reflux esophagitis   . Right ureteral stone   . Type 2 diabetes mellitus without complication, without long-term current use of insulin ([facility]) 07/22/2019  . Unspecified vitamin D deficiency     Patient Active Problem List   Diagnosis Date Noted  . Right ureteral stone 09/16/2019  . Type 2 diabetes mellitus without complication, without long-term current use of insulin ([facility]) 07/22/2019  . HLD (hyperlipidemia) 07/22/2019  . HTN (hypertension) 07/22/2019  . DM (diabetes mellitus) ([facility]) 07/22/2019    Past Surgical History:  Procedure Laterality Date  . CHOLECYSTECTOMY    . CYSTOSCOPY W/ URETERAL STENT PLACEMENT Right 09/16/2019   Procedure: CYSTOSCOPY WITH RETROGRADE PYELOGRAM/URETERAL STENT PLACEMENT;  Surgeon: Heloise Purpura, MD;  Location: [facility];  Service: Urology;  Laterality: Right;  . CYSTOSCOPY/URETEROSCOPY/HOLMIUM LASER/STENT PLACEMENT Right 09/24/2019   Procedure: CYSTOSCOPY/URETEROSCOPY/HOLMIUM  LASER/STENT EXCHANGE;  Surgeon: Heloise Purpura, MD;  Location: [facility];  Service: Urology;  Laterality: Right;     OB History   No obstetric history on file.     Family History  Problem Relation Age of Onset  . Diabetes Mother   . Hypertension Mother   . Diabetes Father   . Hypertension Father   . Diabetes Sister     Social History   Tobacco Use  . Smoking status: Never Smoker  . Smokeless tobacco: Never Used  Vaping Use  . Vaping Use: Never used  Substance Use Topics  . Alcohol use: No  . Drug use: No    Home Medications Prior to Admission medications   Medication Sig Start Date End Date Taking? Authorizing Provider  benazepril (LOTENSIN) 20 MG tablet Take 20 mg by mouth daily.  01/19/19   [provider]  chlorthalidone (HYGROTON) 25 MG tablet Take 25 mg by mouth daily.  04/29/19   [provider]  cholecalciferol (VITAMIN D) 1000 units tablet Take 1,000 Units by mouth daily.    [provider]  LEVEMIR FLEXTOUCH 100 UNIT/ML Pen Inject 20-26 Units as directed 2 (two) times a day.  02/10/19   [provider]  metFORMIN (GLUCOPHAGE) 1000 MG tablet Take 1,000 mg by mouth 2 (two) times daily with a meal.     [provider]  metoprolol succinate (TOPROL-XL) 25 MG 24 hr tablet TAKE 1 TABLET(25 MG) BY MOUTH DAILY 07/19/20   Yates Decamp, MD  omeprazole (PRILOSEC) 40 MG capsule Take 40 mg by mouth  daily as needed (indigestion).    [provider]  Potassium Chloride ER 20 MEQ TBCR Take 20 mEq by mouth daily. 12/20/18   [provider]  rosuvastatin (CRESTOR) 5 MG tablet Take 5 mg by mouth at bedtime.  12/19/18   [provider]  triamterene-hydrochlorothiazide (MAXZIDE-25) 37.5-25 MG per tablet Take 1 tablet by mouth daily.    [provider]    Allergies    Patient has no known allergies.  Review of Systems   Review of Systems  All other systems reviewed and are negative.   Physical Exam Updated Vital  Signs BP 135/82   Pulse 85   Temp 98.3 F (36.8 C) (Oral)   Resp 17   SpO2 98%   Physical Exam Vitals and nursing note reviewed.   64 year old female, resting comfortably and in no acute distress. Vital signs are normal. Oxygen saturation is 98%, which is normal. Head is normocephalic and atraumatic. PERRLA, EOMI. Oropharynx is clear.  Anterior chamber is clear.  There is no conjunctival injection.  There is slight edema of the upper and lower lids of the right eye.  Ocular pressures are 25 on the left eye and 24 in the right eye. Neck is nontender and supple without adenopathy or JVD. Back is nontender and there is no CVA tenderness. Lungs are clear without rales, wheezes, or rhonchi. Chest is nontender. Heart has regular rate and rhythm without murmur. Abdomen is soft, flat, nontender without masses or hepatosplenomegaly and peristalsis is normoactive. Extremities have no cyanosis or edema, full range of motion is present. Skin is warm and dry without rash. Neurologic: Mental status is normal, cranial nerves are intact, there are no motor or sensory deficits.  ED Results / Procedures / Treatments    Procedures Procedures  Medications Ordered in ED Medications  fluorescein ophthalmic strip 1 strip (has no administration in time range)  tetracaine (PONTOCAINE) 0.5 % ophthalmic solution 2 drop (2 drops Both Eyes Given 11/15/20 0703)    ED Course  I have reviewed the triage vital signs and the nursing notes.  MDM Rules/Calculators/A&P Right eye pain 2 weeks status post cataract surgery.  Old records are reviewed, and she has no relevant past visits.  Page was placed for Dr. Karleen Hampshire, on-call for her ophthalmologist Dr. Vonna Kotyk.  No callback was received.  Will attempt to contact her ophthalmologist's office with plan to have her go there for evaluation.  Case is signed out to Dr. Criss Alvine.  Final Clinical Impression(s) / ED Diagnoses Final diagnoses:  Pain in right eye    Rx /  DC Orders ED Discharge Orders    None       Dione Booze, MD 11/15/20 [phone]

## 2020-11-15 NOTE — Discharge Instructions (Signed)
It is important to follow-up with your ophthalmologist, Dr. Vonna Kotyk.  If you cannot get into see him, follow-up with Dr. Jola Schmidt office today.  It is important to be seen by an eye specialist today.

## 2020-11-15 NOTE — ED Triage Notes (Signed)
Patient arrived with family who states she had cataracts removed on Nov 17. Reporting light sensitivity and swelling that started tonight. Declines any drainage.

## 2020-11-15 NOTE — ED Provider Notes (Signed)
Care transferred to me.  Dr. Sherryll Burger was consulted as we cannot get in contact with Dr. Vonna Kotyk.  Otherwise, Dr. Sherryll Burger recommends she needs to be seen in the next 24 hours by either his office or Dr. Vonna Kotyk (would be ideal for Dr. Vonna Kotyk).  Patient and family made aware.   Pricilla Loveless, MD 11/15/20 [phone]

## 2020-11-15 NOTE — ED Notes (Signed)
Pt c/o right eye swelling , denies trauma. Pt states she had right eye surgery on 1// 7

## 2021-02-02 ENCOUNTER — Other Ambulatory Visit (HOSPITAL_COMMUNITY): Payer: Self-pay | Admitting: Internal Medicine

## 2021-02-02 DIAGNOSIS — R0989 Other specified symptoms and signs involving the circulatory and respiratory systems: Secondary | ICD-10-CM

## 2021-02-02 DIAGNOSIS — I999 Unspecified disorder of circulatory system: Secondary | ICD-10-CM

## 2021-02-03 ENCOUNTER — Other Ambulatory Visit: Payer: Self-pay

## 2021-02-03 ENCOUNTER — Ambulatory Visit (HOSPITAL_COMMUNITY)
Admission: RE | Admit: 2021-02-03 | Discharge: 2021-02-03 | Disposition: A | Discharge status: Home / Self Care | Payer: Medicare Other | Source: Ambulatory Visit | Attending: Internal Medicine | Admitting: Internal Medicine

## 2021-02-03 DIAGNOSIS — I999 Unspecified disorder of circulatory system: Secondary | ICD-10-CM

## 2021-02-03 DIAGNOSIS — R0989 Other specified symptoms and signs involving the circulatory and respiratory systems: Secondary | ICD-10-CM

## 2021-05-13 ENCOUNTER — Other Ambulatory Visit: Payer: Self-pay | Admitting: Cardiology

## 2022-03-19 ENCOUNTER — Other Ambulatory Visit: Payer: Self-pay | Admitting: Internal Medicine

## 2022-03-19 DIAGNOSIS — R600 Localized edema: Secondary | ICD-10-CM

## 2022-03-20 ENCOUNTER — Ambulatory Visit
Admission: RE | Admit: 2022-03-20 | Discharge: 2022-03-20 | Disposition: A | Discharge status: Home / Self Care | Payer: Medicare Other | Source: Ambulatory Visit | Attending: Internal Medicine | Admitting: Internal Medicine

## 2022-03-20 DIAGNOSIS — R600 Localized edema: Secondary | ICD-10-CM

## 2022-03-26 ENCOUNTER — Ambulatory Visit
Admission: RE | Admit: 2022-03-26 | Discharge: 2022-03-26 | Disposition: A | Discharge status: Home / Self Care | Payer: Medicare Other | Source: Ambulatory Visit | Attending: Internal Medicine | Admitting: Internal Medicine

## 2022-03-26 ENCOUNTER — Other Ambulatory Visit: Payer: Self-pay | Admitting: Internal Medicine

## 2022-03-26 DIAGNOSIS — M25561 Pain in right knee: Secondary | ICD-10-CM

## 2022-03-26 DIAGNOSIS — M25551 Pain in right hip: Secondary | ICD-10-CM

## 2022-05-11 ENCOUNTER — Other Ambulatory Visit: Payer: Self-pay | Admitting: Cardiology

## 2022-09-21 ENCOUNTER — Ambulatory Visit (HOSPITAL_COMMUNITY)
Admission: EM | Admit: 2022-09-21 | Discharge: 2022-09-21 | Disposition: A | Discharge status: Home / Self Care | Payer: Medicare Other | Attending: Physician Assistant | Admitting: Physician Assistant

## 2022-09-21 ENCOUNTER — Encounter (HOSPITAL_COMMUNITY): Payer: Self-pay | Admitting: Emergency Medicine

## 2022-09-21 DIAGNOSIS — R1084 Generalized abdominal pain: Secondary | ICD-10-CM | POA: Diagnosis not present

## 2022-09-21 DIAGNOSIS — R059 Cough, unspecified: Secondary | ICD-10-CM | POA: Diagnosis not present

## 2022-09-21 DIAGNOSIS — R197 Diarrhea, unspecified: Secondary | ICD-10-CM

## 2022-09-21 MED ORDER — DEXTROMETHORPHAN HBR 15 MG/5ML PO SYRP
10.0000 mL | ORAL_SOLUTION | Freq: Four times a day (QID) | ORAL | 0 refills | Status: DC | PRN
Start: 2022-09-21 — End: 2023-02-13

## 2022-09-21 MED ORDER — DICYCLOMINE HCL 20 MG PO TABS: 10.0000 mg | ORAL_TABLET | Freq: Two times a day (BID) | ORAL | 0 refills | Status: AC

## 2022-09-21 NOTE — ED Provider Notes (Signed)
[facility]    CSN: [id number] Arrival date & time: 09/21/22  4967      History   Chief Complaint Chief Complaint  Patient presents with   Abdominal Pain   Diarrhea    HPI [name] is a 66 y.o. female.   Patient here today for evaluation of lower abdominal pain and diarrhea that began abruptly at 1 am. Her sister who is also her caregiver is here with her providing history. She reports that [name] did not eat as much last night - she had chicken sandwich for lunch (sister had same) and last night only ate a yogurt before bed. She woke around 1 am and had about 3 loose bowel movements. Diarrhea has now subsided but patient reports some continued abdominal pain that is mild. She has not had any nausea or vomiting. She has not had fever. She denies any blood in her stool or dark tarry stools. She has not had any constipation prior to symptom onset. She took tums with mild relief. Sister requests medication for abdominal pain.   She also has had a cough at times for the last week or so. Sister requests medication for cough if possible.   The history is provided by the patient and a relative.    Past Medical History:  Diagnosis Date   Diabetes mellitus    DM (diabetes mellitus) ([place]) 07/22/2019   HLD (hyperlipidemia) 07/22/2019   HTN (hypertension) 07/22/2019   Hyperlipidemia    Hypertension    Obesity, unspecified    Reflux esophagitis    Right ureteral stone    Type 2 diabetes mellitus without complication, without long-term current use of insulin ([place]) 07/22/2019   Unspecified vitamin D deficiency     Patient Active Problem List   Diagnosis Date Noted   Right ureteral stone 09/16/2019   Type 2 diabetes mellitus without complication, without long-term current use of insulin ([place]) 07/22/2019   HLD (hyperlipidemia) 07/22/2019   HTN (hypertension) 07/22/2019   DM (diabetes mellitus) ([place]) 07/22/2019    Past Surgical History:  Procedure Laterality Date    CHOLECYSTECTOMY     CYSTOSCOPY W/ URETERAL STENT PLACEMENT Right 09/16/2019   Procedure: CYSTOSCOPY WITH RETROGRADE PYELOGRAM/URETERAL STENT PLACEMENT;  Surgeon: Raynelle Bring, MD;  Location: [facility];  Service: Urology;  Laterality: Right;   CYSTOSCOPY/URETEROSCOPY/HOLMIUM LASER/STENT PLACEMENT Right 09/24/2019   Procedure: CYSTOSCOPY/URETEROSCOPY/HOLMIUM LASER/STENT EXCHANGE;  Surgeon: Raynelle Bring, MD;  Location: [facility];  Service: Urology;  Laterality: Right;    OB History   No obstetric history on file.      Home Medications    Prior to Admission medications   Medication Sig Start Date End Date Taking? Authorizing Provider  dextromethorphan 15 MG/5ML syrup Take 10 mLs (30 mg total) by mouth 4 (four) times daily as needed for cough. 09/21/22  Yes Francene Finders, PA-C  dicyclomine (BENTYL) 20 MG tablet Take 0.5 tablets (10 mg total) by mouth 2 (two) times daily. 09/21/22  Yes Francene Finders, PA-C  benazepril (LOTENSIN) 20 MG tablet Take 20 mg by mouth daily.  01/19/19   [provider]  chlorthalidone (HYGROTON) 25 MG tablet Take 25 mg by mouth daily.  04/29/19   [provider]  cholecalciferol (VITAMIN D) 1000 units tablet Take 1,000 Units by mouth daily.    [provider]  LEVEMIR FLEXTOUCH 100 UNIT/ML Pen Inject 20-26 Units as directed 2 (two) times a day.  02/10/19   [provider]  metFORMIN (GLUCOPHAGE) 1000 MG tablet  Take 1,000 mg by mouth 2 (two) times daily with a meal.     [provider]  metoprolol succinate (TOPROL-XL) 25 MG 24 hr tablet TAKE 1 TABLET(25 MG) BY MOUTH DAILY 05/11/22   Yates Decamp, MD  omeprazole (PRILOSEC) 40 MG capsule Take 40 mg by mouth daily as needed (indigestion).    [provider]  Potassium Chloride ER 20 MEQ TBCR Take 20 mEq by mouth daily. 12/20/18   [provider]  rosuvastatin (CRESTOR) 5 MG tablet Take 5 mg by mouth at bedtime.  12/19/18   [provider]   triamterene-hydrochlorothiazide (MAXZIDE-25) 37.5-25 MG per tablet Take 1 tablet by mouth daily.    [provider]    Family History Family History  Problem Relation Age of Onset   Diabetes Mother    Hypertension Mother    Diabetes Father    Hypertension Father    Diabetes Sister     Social History Social History   Tobacco Use   Smoking status: Never   Smokeless tobacco: Never  Vaping Use   Vaping Use: Never used  Substance Use Topics   Alcohol use: No   Drug use: No     Allergies   Patient has no known allergies.   Review of Systems Review of Systems  Constitutional:  Negative for chills and fever.  Eyes:  Negative for discharge and redness.  Respiratory:  Positive for cough. Negative for shortness of breath.   Gastrointestinal:  Positive for abdominal pain and diarrhea. Negative for blood in stool, nausea and vomiting.     Physical Exam Triage Vital Signs ED Triage Vitals  Enc Vitals Group     BP      Pulse      Resp      Temp      Temp src      SpO2      Weight      Height      Head Circumference      Peak Flow      Pain Score      Pain Loc      Pain Edu?      Excl. in GC?    No data found.  Updated Vital Signs BP 137/68 (BP Location: Left Arm)   Pulse 88   Temp 97.8 F (36.6 C) (Oral)   Resp 18   SpO2 98%   Physical Exam Vitals and nursing note reviewed.  Constitutional:      General: She is not in acute distress.    Appearance: Normal appearance. She is not ill-appearing.  HENT:     Head: Normocephalic and atraumatic.  Eyes:     Conjunctiva/sclera: Conjunctivae normal.  Cardiovascular:     Rate and Rhythm: Normal rate and regular rhythm.     Heart sounds: Normal heart sounds.  Pulmonary:     Effort: Pulmonary effort is normal. No respiratory distress.     Breath sounds: Normal breath sounds. No wheezing, rhonchi or rales.  Abdominal:     General: Abdomen is flat. Bowel sounds are normal. There is no distension.      Palpations: Abdomen is soft.     Tenderness: There is no abdominal tenderness. There is no guarding or rebound.  Neurological:     Mental Status: She is alert.  Psychiatric:        Mood and Affect: Mood normal.        Behavior: Behavior normal.        Thought  Content: Thought content normal.      UC Treatments / Results  Labs (all labs ordered are listed, but only abnormal results are displayed) Labs Reviewed - No data to display  EKG   Radiology No results found.  Procedures Procedures (including critical care time)  Medications Ordered in UC Medications - No data to display  Initial Impression / Assessment and Plan / UC Course  I have reviewed the triage vital signs and the nursing notes.  Pertinent labs & imaging results that were available during my care of the patient were reviewed by me and considered in my medical decision making (see chart for details).    Half dose dicyclomine prescribed for abdominal cramping as requested. Dextromethorphan also prescribed. Encouraged follow up if symptoms do not continue to improve or worsen in any way.   Final Clinical Impressions(s) / UC Diagnoses   Final diagnoses:  Generalized abdominal pain  Diarrhea, unspecified type  Cough, unspecified type   Discharge Instructions   None    ED Prescriptions     Medication Sig Dispense Auth. Provider   dicyclomine (BENTYL) 20 MG tablet Take 0.5 tablets (10 mg total) by mouth 2 (two) times daily. 10 tablet Tomi Bamberger, PA-C   dextromethorphan 15 MG/5ML syrup Take 10 mLs (30 mg total) by mouth 4 (four) times daily as needed for cough. 120 mL Tomi Bamberger, PA-C      PDMP not reviewed this encounter.   Tomi Bamberger, PA-C 09/21/22 1012

## 2022-09-21 NOTE — ED Triage Notes (Signed)
Pt reports lower abdominal pain and diarrhea. States the abdominal pain and diarrhea began around 1 am this morning. Since then the diarrhea has resolved. Reports taking tums for relief.

## 2023-02-13 ENCOUNTER — Ambulatory Visit (AMBULATORY_SURGERY_CENTER): Payer: Medicare Other | Admitting: *Deleted

## 2023-02-13 VITALS — Ht 64.0 in | Wt 199.0 lb

## 2023-02-13 DIAGNOSIS — Z1211 Encounter for screening for malignant neoplasm of colon: Secondary | ICD-10-CM

## 2023-02-13 MED ORDER — PEG 3350-KCL-NA BICARB-NACL 420 G PO SOLR: 4000.0000 mL | Freq: Once | ORAL | 0 refills | Status: AC

## 2023-02-13 NOTE — Progress Notes (Signed)

## 2023-02-25 ENCOUNTER — Ambulatory Visit (AMBULATORY_SURGERY_CENTER): Payer: Medicare Other | Admitting: Gastroenterology

## 2023-02-25 ENCOUNTER — Encounter: Payer: Self-pay | Admitting: Gastroenterology

## 2023-02-25 VITALS — BP 117/69 | HR 81 | Temp 97.8°F | Resp 11 | Ht 64.0 in | Wt 199.0 lb

## 2023-02-25 DIAGNOSIS — D124 Benign neoplasm of descending colon: Secondary | ICD-10-CM

## 2023-02-25 DIAGNOSIS — Z1211 Encounter for screening for malignant neoplasm of colon: Secondary | ICD-10-CM | POA: Diagnosis not present

## 2023-02-25 DIAGNOSIS — D125 Benign neoplasm of sigmoid colon: Secondary | ICD-10-CM

## 2023-02-25 DIAGNOSIS — D128 Benign neoplasm of rectum: Secondary | ICD-10-CM

## 2023-02-25 DIAGNOSIS — K5730 Diverticulosis of large intestine without perforation or abscess without bleeding: Secondary | ICD-10-CM

## 2023-02-25 DIAGNOSIS — D122 Benign neoplasm of ascending colon: Secondary | ICD-10-CM

## 2023-02-25 DIAGNOSIS — K640 First degree hemorrhoids: Secondary | ICD-10-CM

## 2023-02-25 MED ORDER — SODIUM CHLORIDE 0.9 % IV SOLN: 500.0000 mL | Freq: Once | INTRAVENOUS | Status: DC

## 2023-02-25 NOTE — Progress Notes (Signed)
Vss nad trans to pacu 

## 2023-02-25 NOTE — Patient Instructions (Signed)
Handouts provided on polyps, diverticulosis and hemorrhoids.   Resume previous diet.  Continue present medications.  Await pathology results.  Repeat colonoscopy fur surveillance based on pathology results.  Return to GI clinic as needed.   YOU HAD AN ENDOSCOPIC PROCEDURE TODAY AT [facility]:   Refer to the procedure report that was given to you for any specific questions about what was found during the examination.  If the procedure report does not answer your questions, please call your gastroenterologist to clarify.  If you requested that your care partner not be given the details of your procedure findings, then the procedure report has been included in a sealed envelope for you to review at your convenience later.  YOU SHOULD EXPECT: Some feelings of bloating in the abdomen. Passage of more gas than usual.  Walking can help get rid of the air that was put into your GI tract during the procedure and reduce the bloating. If you had a lower endoscopy (such as a colonoscopy or flexible sigmoidoscopy) you may notice spotting of blood in your stool or on the toilet paper. If you underwent a bowel prep for your procedure, you may not have a normal bowel movement for a few days.  Please Note:  You might notice some irritation and congestion in your nose or some drainage.  This is from the oxygen used during your procedure.  There is no need for concern and it should clear up in a day or so.  SYMPTOMS TO REPORT IMMEDIATELY:  Following lower endoscopy (colonoscopy or flexible sigmoidoscopy):  Excessive amounts of blood in the stool  Significant tenderness or worsening of abdominal pains  Swelling of the abdomen that is new, acute  Fever of 100F or higher  For urgent or emergent issues, a gastroenterologist can be reached at any hour by calling [phone]. Do not use MyChart messaging for urgent concerns.    DIET:  We do recommend a small meal at first, but then you may  proceed to your regular diet.  Drink plenty of fluids but you should avoid alcoholic beverages for 24 hours.  ACTIVITY:  You should plan to take it easy for the rest of today and you should NOT DRIVE or use heavy machinery until tomorrow (because of the sedation medicines used during the test).    FOLLOW UP: Our staff will call the number listed on your records the next business day following your procedure.  We will call around 7:15- 8:00 am to check on you and address any questions or concerns that you may have regarding the information given to you following your procedure. If we do not reach you, we will leave a message.     If any biopsies were taken you will be contacted by phone or by letter within the next 1-3 weeks.  Please call us at [phone] if you have not heard about the biopsies in 3 weeks.    SIGNATURES/CONFIDENTIALITY: You and/or your care partner have signed paperwork which will be entered into your electronic medical record.  These signatures attest to the fact that that the information above on your After Visit Summary has been reviewed and is understood.  Full responsibility of the confidentiality of this discharge information lies with you and/or your care-partner.

## 2023-02-25 NOTE — Progress Notes (Signed)
GASTROENTEROLOGY PROCEDURE H&P NOTE   Primary Care Physician: Rogers Blocker, MD    Reason for Procedure:  Colon Cancer screening  Plan:    Colonoscopy  Patient is appropriate for endoscopic procedure(s) in the ambulatory ([facility]) setting.  The nature of the procedure, as well as the risks, benefits, and alternatives were carefully and thoroughly reviewed with the patient. Ample time for discussion and questions allowed. The patient understood, was satisfied, and agreed to proceed.     HPI: [name] is a 67 y.o. female who presents for colonoscopy for routine Colon Cancer screening.  No active GI symptoms.  No known family history of colon cancer or related malignancy.  Patient is otherwise without complaints or active issues today.  Past Medical History:  Diagnosis Date   Arthritis    knee   Cataract    bilatera,removed   Diabetes mellitus    DM (diabetes mellitus) ([place]) 07/22/2019   GERD (gastroesophageal reflux disease)    HLD (hyperlipidemia) 07/22/2019   HTN (hypertension) 07/22/2019   Hyperlipidemia    Hypertension    Obesity, unspecified    Reflux esophagitis    Right ureteral stone    Type 2 diabetes mellitus without complication, without long-term current use of insulin ([place]) 07/22/2019   Unspecified vitamin D deficiency     Past Surgical History:  Procedure Laterality Date   cataracts Bilateral    CHOLECYSTECTOMY     CYSTOSCOPY W/ URETERAL STENT PLACEMENT Right 09/16/2019   Procedure: CYSTOSCOPY WITH RETROGRADE PYELOGRAM/URETERAL STENT PLACEMENT;  Surgeon: Raynelle Bring, MD;  Location: [facility];  Service: Urology;  Laterality: Right;   CYSTOSCOPY/URETEROSCOPY/HOLMIUM LASER/STENT PLACEMENT Right 09/24/2019   Procedure: CYSTOSCOPY/URETEROSCOPY/HOLMIUM LASER/STENT EXCHANGE;  Surgeon: Raynelle Bring, MD;  Location: [facility];  Service: Urology;  Laterality: Right;   HAND SURGERY Right     Prior to Admission medications   Medication Sig Start Date End Date  Taking? Authorizing Provider  benazepril (LOTENSIN) 20 MG tablet Take 20 mg by mouth daily.  01/19/19  Yes [provider]  chlorthalidone (HYGROTON) 25 MG tablet Take 25 mg by mouth daily.  04/29/19  Yes [provider]  cholecalciferol (VITAMIN D) 1000 units tablet Take 1,000 Units by mouth daily.   Yes [provider]  dicyclomine (BENTYL) 20 MG tablet Take 0.5 tablets (10 mg total) by mouth 2 (two) times daily. 09/21/22  Yes Francene Finders, PA-C  Insulin Aspart (NOVOLOG [place]) Inject into the skin. SLIDING SCALE 5-12 UNITS   Yes [provider]  LEVEMIR FLEXTOUCH 100 UNIT/ML Pen Inject 20-26 Units as directed 2 (two) times a day.  02/10/19  Yes [provider]  metoprolol succinate (TOPROL-XL) 25 MG 24 hr tablet TAKE 1 TABLET(25 MG) BY MOUTH DAILY 05/11/22  Yes Adrian Prows, MD  omeprazole (PRILOSEC) 40 MG capsule Take 40 mg by mouth daily as needed (indigestion).   Yes [provider]  Potassium Chloride ER 20 MEQ TBCR Take 20 mEq by mouth daily. 12/20/18  Yes [provider]  rosuvastatin (CRESTOR) 5 MG tablet Take 5 mg by mouth at bedtime.  12/19/18  Yes [provider]  Semaglutide (RYBELSUS) 7 MG TABS Take by mouth daily.   Yes [provider]  allopurinol (ZYLOPRIM) 100 MG tablet Take 1 tablet by mouth daily.    [provider]    Current Outpatient Medications  Medication Sig Dispense Refill   benazepril (LOTENSIN) 20 MG tablet Take 20 mg by mouth daily.      chlorthalidone (  HYGROTON) 25 MG tablet Take 25 mg by mouth daily.      cholecalciferol (VITAMIN D) 1000 units tablet Take 1,000 Units by mouth daily.     dicyclomine (BENTYL) 20 MG tablet Take 0.5 tablets (10 mg total) by mouth 2 (two) times daily. 10 tablet 0   Insulin Aspart (NOVOLOG [place]) Inject into the skin. SLIDING SCALE 5-12 UNITS     LEVEMIR FLEXTOUCH 100 UNIT/ML Pen Inject 20-26 Units as directed 2 (two) times a day.      metoprolol succinate  (TOPROL-XL) 25 MG 24 hr tablet TAKE 1 TABLET(25 MG) BY MOUTH DAILY 90 tablet 3   omeprazole (PRILOSEC) 40 MG capsule Take 40 mg by mouth daily as needed (indigestion).     Potassium Chloride ER 20 MEQ TBCR Take 20 mEq by mouth daily.     rosuvastatin (CRESTOR) 5 MG tablet Take 5 mg by mouth at bedtime.      Semaglutide (RYBELSUS) 7 MG TABS Take by mouth daily.     allopurinol (ZYLOPRIM) 100 MG tablet Take 1 tablet by mouth daily.     Current Facility-Administered Medications  Medication Dose Route Frequency Provider Last Rate Last Admin   0.9 %  sodium chloride infusion  500 mL Intravenous Once Griff Badley V, DO        Allergies as of 02/25/2023   (No Known Allergies)    Family History  Problem Relation Age of Onset   Diabetes Mother    Hypertension Mother    Diabetes Father    Hypertension Father    Colon polyps Sister    Diabetes Sister    Colon cancer Neg Hx    Crohn's disease Neg Hx    Esophageal cancer Neg Hx    Rectal cancer Neg Hx    Stomach cancer Neg Hx    Ulcerative colitis Neg Hx     Social History   Socioeconomic History   Marital status: Widowed    Spouse name: Not on file   Number of children: 0   Years of education: Not on file   Highest education level: Not on file  Occupational History   Not on file  Tobacco Use   Smoking status: Never   Smokeless tobacco: Never  Vaping Use   Vaping Use: Never used  Substance and Sexual Activity   Alcohol use: No   Drug use: No   Sexual activity: Not on file  Other Topics Concern   Not on file  Social History Narrative   Not on file   Social Determinants of Health   Financial Resource Strain: Not on file  Food Insecurity: Not on file  Transportation Needs: Not on file  Physical Activity: Not on file  Stress: Not on file  Social Connections: Not on file  Intimate Partner Violence: Not on file    Physical Exam: Vital signs in last 24 hours: '@BP'$  (!) 118/59   Pulse 96   Temp 97.8 F (36.6 C)    Ht '5\' 4"'$  (1.626 m)   Wt 199 lb (90.3 kg)   SpO2 99%   BMI 34.16 kg/m  GEN: NAD EYE: Sclerae anicteric ENT: MMM CV: Non-tachycardic Pulm: CTA b/l GI: Soft, NT/ND NEURO:  Alert & Oriented x [place], DO [facility]   02/25/2023 8:11 AM

## 2023-02-25 NOTE — Progress Notes (Signed)
Pt's states no medical or surgical changes since previsit or office visit. 

## 2023-02-25 NOTE — Progress Notes (Signed)
Called to room to assist during endoscopic procedure.  Patient ID and intended procedure confirmed with present staff. Received instructions for my participation in the procedure from the performing physician.  

## 2023-02-25 NOTE — Op Note (Signed)
[facility] Patient Name: [name] Procedure Date: 02/25/2023 7:36 AM MRN: [id number] Endoscopist: Gerrit Heck , MD, [id number] Age: 67 Referring MD:  Date of Birth: November 04, 1956 Gender: Female Account #: [id number] Procedure:                Colonoscopy Indications:              Screening for colorectal malignant neoplasm, This                            is the patient's first colonoscopy Medicines:                Monitored Anesthesia Care Procedure:                Pre-Anesthesia Assessment:                           - Prior to the procedure, a History and Physical                            was performed, and patient medications and                            allergies were reviewed. The patient's tolerance of                            previous anesthesia was also reviewed. The risks                            and benefits of the procedure and the sedation                            options and risks were discussed with the patient.                            All questions were answered, and informed consent                            was obtained. Prior Anticoagulants: The patient has                            taken no anticoagulant or antiplatelet agents. ASA                            Grade Assessment: II - A patient with mild systemic                            disease. After reviewing the risks and benefits,                            the patient was deemed in satisfactory condition to                            undergo the procedure.  After obtaining informed consent, the colonoscope                            was passed under direct vision. Throughout the                            procedure, the patient's blood pressure, pulse, and                            oxygen saturations were monitored continuously. The                            Olympus CF-HQ190L ([id number]) Colonoscope was                            introduced through the  anus and advanced to the the                            cecum, identified by the appendiceal orifice, IC                            valve and transillumination. The colonoscopy was                            performed without difficulty. The patient tolerated                            the procedure well. The quality of the bowel                            preparation was good. The ileocecal valve,                            appendiceal orifice, and rectum were photographed. Scope In: 8:18:54 AM Scope Out: 8:52:50 AM Scope Withdrawal Time: 0 hours 20 minutes 55 seconds  Total Procedure Duration: 0 hours 33 minutes 56 seconds  Findings:                 The perianal and digital rectal examinations were                            normal.                           Two sessile polyps were found in the ascending                            colon. The polyps were 2 to 4 mm in size. These                            polyps were removed with a cold snare. Resection                            and retrieval were complete. Estimated blood loss  was minimal.                           Two sessile polyps were found in the sigmoid colon                            and descending colon. The polyps were 3 to 5 mm in                            size. These polyps were removed with a cold snare.                            Resection and retrieval were complete. Estimated                            blood loss was minimal.                           A 4 mm polyp was found in the rectum. The polyp was                            sessile. The polyp was removed with a cold snare.                            Resection and retrieval were complete. Estimated                            blood loss was minimal.                           A few small-mouthed diverticula were found in the                            sigmoid colon.                           Non-bleeding internal hemorrhoids were found  during                            retroflexion. The hemorrhoids were small. Complications:            No immediate complications. Estimated Blood Loss:     Estimated blood loss was minimal. Impression:               - Two 2 to 4 mm polyps in the ascending colon,                            removed with a cold snare. Resected and retrieved.                           - Two 3 to 5 mm polyps in the sigmoid colon and in                            the descending colon, removed with  a cold snare.                            Resected and retrieved.                           - One 4 mm polyp in the rectum, removed with a cold                            snare. Resected and retrieved.                           - Diverticulosis in the sigmoid colon.                           - Non-bleeding internal hemorrhoids.                           - The GI Genius (intelligent endoscopy module),                            computer-aided polyp detection system powered by AI                            was utilized to detect colorectal polyps through                            enhanced visualization during colonoscopy. Recommendation:           - Patient has a contact number available for                            emergencies. The signs and symptoms of potential                            delayed complications were discussed with the                            patient. Return to normal activities tomorrow.                            Written discharge instructions were provided to the                            patient.                           - Resume previous diet.                           - Continue present medications.                           - Await pathology results.                           - Repeat colonoscopy for surveillance based on  pathology results.                           - Return to GI clinic PRN. Gerrit Heck, MD 02/25/2023 8:59:36 AM

## 2023-02-26 ENCOUNTER — Telehealth: Payer: Self-pay | Admitting: *Deleted

## 2023-02-26 NOTE — Telephone Encounter (Signed)
  Follow up Call-     02/25/2023    7:26 AM  Call back number  Post procedure Call Back phone  # [phone]  Permission to leave phone message Yes     Patient questions:  Do you have a fever, pain , or abdominal swelling? No. Pain Score  0 *  Have you tolerated food without any problems? Yes.    Have you been able to return to your normal activities? Yes.    Do you have any questions about your discharge instructions: Diet   No. Medications  No. Follow up visit  No.  Do you have questions or concerns about your Care? No.  Actions: * If pain score is 4 or above: No action needed, pain <4.

## 2023-03-07 ENCOUNTER — Encounter: Payer: Self-pay | Admitting: Gastroenterology

## 2023-05-16 ENCOUNTER — Other Ambulatory Visit: Payer: Self-pay | Admitting: Cardiology

## 2023-10-27 ENCOUNTER — Encounter (HOSPITAL_COMMUNITY): Payer: Self-pay

## 2023-10-27 ENCOUNTER — Ambulatory Visit (HOSPITAL_COMMUNITY)
Admission: EM | Admit: 2023-10-27 | Discharge: 2023-10-27 | Disposition: A | Discharge status: Home / Self Care | Payer: Medicare Other

## 2023-10-27 DIAGNOSIS — N939 Abnormal uterine and vaginal bleeding, unspecified: Secondary | ICD-10-CM | POA: Diagnosis not present

## 2023-10-27 DIAGNOSIS — R1032 Left lower quadrant pain: Secondary | ICD-10-CM | POA: Diagnosis not present

## 2023-10-27 DIAGNOSIS — N3001 Acute cystitis with hematuria: Secondary | ICD-10-CM

## 2023-10-27 LAB — POCT URINALYSIS DIP (MANUAL ENTRY)
Bilirubin, UA: NEGATIVE
Glucose, UA: NEGATIVE mg/dL
Ketones, POC UA: NEGATIVE mg/dL
Leukocytes, UA: NEGATIVE
Nitrite, UA: NEGATIVE
Protein Ur, POC: 30 mg/dL — AB
Spec Grav, UA: 1.025 (ref 1.010–1.025)
Urobilinogen, UA: 0.2 U/dL
pH, UA: 5.5 (ref 5.0–8.0)

## 2023-10-27 MED ORDER — CIPROFLOXACIN HCL 500 MG PO TABS: 500.0000 mg | ORAL_TABLET | Freq: Two times a day (BID) | ORAL | 0 refills | Status: AC

## 2023-10-27 NOTE — ED Triage Notes (Signed)
Vaginal bleeding, no pain, urinary frequency onset yesterday.

## 2023-10-27 NOTE — Discharge Instructions (Addendum)
Possible UTI but can not completely rule out a vaginal source or diverticulitis. Will treat with Cipro to cover urine and partially cover for diverticulitis. Still would keep follow up with GYN to rule out vaginal source.   Cipro 500 mg 1 twice daily for 7 days. Take with food.  Watch for worsening abdominal pain, fevers, chills, nausea, vomiting as this could indicate possible diverticulitis vs worsening infection.

## 2023-10-27 NOTE — ED Provider Notes (Signed)
[facility]    CSN: [id number] Arrival date & time: 10/27/23  1002      History   Chief Complaint Chief Complaint  Patient presents with  . Vaginal Bleeding    HPI [name] is a 67 y.o. female.   67 yr old female who presents to urgent care with frequency and bleeding that may be urine or vaginal. Had some spotting last night but then starting this morning with spotting and blood in urine. Having frequency as well. Denies fevers, chills, appetite change. Possibly having some lower abdominal pain in the left lower quadrant this morning. No concern for STI. Normal pap in September and Hgb A1C of 7. Does not have a period as she was given a medication years ago to stop it due to her mental condition.    Vaginal Bleeding Associated symptoms: no abdominal pain, no back pain, no dysuria and no fever     Past Medical History:  Diagnosis Date  . Arthritis    knee  . Cataract    bilatera,removed  . Diabetes mellitus   . DM (diabetes mellitus) (HCC) 07/22/2019  . GERD (gastroesophageal reflux disease)   . HLD (hyperlipidemia) 07/22/2019  . HTN (hypertension) 07/22/2019  . Hyperlipidemia   . Hypertension   . Obesity, unspecified   . Reflux esophagitis   . Right ureteral stone   . Type 2 diabetes mellitus without complication, without long-term current use of insulin (HCC) 07/22/2019  . Unspecified vitamin D deficiency     Patient Active Problem List   Diagnosis Date Noted  . Right ureteral stone 09/16/2019  . Type 2 diabetes mellitus without complication, without long-term current use of insulin (HCC) 07/22/2019  . HLD (hyperlipidemia) 07/22/2019  . HTN (hypertension) 07/22/2019  . DM (diabetes mellitus) (HCC) 07/22/2019    Past Surgical History:  Procedure Laterality Date  . cataracts Bilateral   . CHOLECYSTECTOMY    . CYSTOSCOPY W/ URETERAL STENT PLACEMENT Right 09/16/2019   Procedure: CYSTOSCOPY WITH RETROGRADE PYELOGRAM/URETERAL STENT PLACEMENT;   Surgeon: Heloise Purpura, MD;  Location: [facility];  Service: Urology;  Laterality: Right;  . CYSTOSCOPY/URETEROSCOPY/HOLMIUM LASER/STENT PLACEMENT Right 09/24/2019   Procedure: CYSTOSCOPY/URETEROSCOPY/HOLMIUM LASER/STENT EXCHANGE;  Surgeon: Heloise Purpura, MD;  Location: [facility];  Service: Urology;  Laterality: Right;  . HAND SURGERY Right     OB History   No obstetric history on file.      Home Medications    Prior to Admission medications   Medication Sig Start Date End Date Taking? Authorizing Provider  allopurinol (ZYLOPRIM) 100 MG tablet Take 1 tablet by mouth daily.   Yes [provider]  benazepril (LOTENSIN) 20 MG tablet Take 20 mg by mouth daily.  01/19/19  Yes [provider]  chlorthalidone (HYGROTON) 25 MG tablet Take 25 mg by mouth daily.  04/29/19  Yes [provider]  cholecalciferol (VITAMIN D) 1000 units tablet Take 1,000 Units by mouth daily.   Yes [provider]  Insulin Aspart (NOVOLOG [place]) Inject into the skin. SLIDING SCALE 5-12 UNITS   Yes [provider]  metoprolol succinate (TOPROL-XL) 25 MG 24 hr tablet TAKE 1 TABLET(25 MG) BY MOUTH DAILY 05/11/22  Yes Yates Decamp, MD  Potassium Chloride ER 20 MEQ TBCR Take 20 mEq by mouth daily. 12/20/18  Yes [provider]  rosuvastatin (CRESTOR) 5 MG tablet Take 5 mg by mouth at bedtime.  12/19/18  Yes [provider]  Semaglutide (RYBELSUS) 7 MG TABS Take by mouth daily.  Yes [provider]  TRESIBA FLEXTOUCH 200 UNIT/ML FlexTouch Pen Inject into the skin. 09/16/23  Yes [provider]  dicyclomine (BENTYL) 20 MG tablet Take 0.5 tablets (10 mg total) by mouth 2 (two) times daily. 09/21/22   Tomi Bamberger, PA-C  omeprazole (PRILOSEC) 40 MG capsule Take 40 mg by mouth daily as needed (indigestion).    [provider]    Family History Family History  Problem Relation Age of Onset  . Diabetes Mother   . Hypertension Mother   . Diabetes  Father   . Hypertension Father   . Colon polyps Sister   . Diabetes Sister   . Colon cancer Neg Hx   . Crohn's disease Neg Hx   . Esophageal cancer Neg Hx   . Rectal cancer Neg Hx   . Stomach cancer Neg Hx   . Ulcerative colitis Neg Hx     Social History Social History   Tobacco Use  . Smoking status: Never  . Smokeless tobacco: Never  Vaping Use  . Vaping status: Never Used  Substance Use Topics  . Alcohol use: No  . Drug use: No     Allergies   Patient has no known allergies.   Review of Systems Review of Systems  Constitutional:  Negative for chills and fever.  HENT:  Negative for ear pain and sore throat.   Eyes:  Negative for pain and visual disturbance.  Respiratory:  Negative for cough and shortness of breath.   Cardiovascular:  Negative for chest pain and palpitations.  Gastrointestinal:  Negative for abdominal pain and vomiting.  Genitourinary:  Positive for frequency and vaginal bleeding. Negative for dysuria and hematuria.  Musculoskeletal:  Negative for arthralgias and back pain.  Skin:  Negative for color change and rash.  Neurological:  Negative for seizures and syncope.  All other systems reviewed and are negative.    Physical Exam Triage Vital Signs ED Triage Vitals  Encounter Vitals Group     BP 10/27/23 1015 (!) 159/89     Systolic BP Percentile --      Diastolic BP Percentile --      Pulse Rate 10/27/23 1015 80     Resp 10/27/23 1015 18     Temp 10/27/23 1015 (!) 97.5 F (36.4 C)     Temp Source 10/27/23 1015 Oral     SpO2 10/27/23 1015 93 %     Weight 10/27/23 1015 199 lb 1.2 oz (90.3 kg)     Height 10/27/23 1015 5\' 4"  (1.626 m)     Head Circumference --      Peak Flow --      Pain Score 10/27/23 1013 0     Pain Loc --      Pain Education --      Exclude from Growth Chart --    No data found.  Updated Vital Signs BP (!) 159/89 (BP Location: Left Arm)   Pulse 80   Temp (!) 97.5 F (36.4 C) (Oral)   Resp 18   Ht 5\' 4"   (1.626 m)   Wt 199 lb 1.2 oz (90.3 kg)   SpO2 93%   BMI 34.17 kg/m   Visual Acuity Right Eye Distance:   Left Eye Distance:   Bilateral Distance:    Right Eye Near:   Left Eye Near:    Bilateral Near:     Physical Exam Vitals and nursing note reviewed.  Constitutional:      General: She is not  in acute distress.    Appearance: She is well-developed.  HENT:     Head: Normocephalic and atraumatic.  Eyes:     Conjunctiva/sclera: Conjunctivae normal.  Cardiovascular:     Rate and Rhythm: Normal rate and regular rhythm.     Heart sounds: No murmur heard. Pulmonary:     Effort: Pulmonary effort is normal. No respiratory distress.     Breath sounds: Normal breath sounds.  Abdominal:     Palpations: Abdomen is soft.     Tenderness: There is no abdominal tenderness.    Musculoskeletal:        General: No swelling.     Cervical back: Neck supple.  Skin:    General: Skin is warm and dry.     Capillary Refill: Capillary refill takes less than 2 seconds.  Neurological:     Mental Status: She is alert.  Psychiatric:        Mood and Affect: Mood normal.     UC Treatments / Results  Labs (all labs ordered are listed, but only abnormal results are displayed) Labs Reviewed  POCT URINALYSIS DIP (MANUAL ENTRY)    EKG   Radiology No results found.  Procedures Procedures (including critical care time)  Medications Ordered in UC Medications - No data to display  Initial Impression / Assessment and Plan / UC Course  I have reviewed the triage vital signs and the nursing notes.  Pertinent labs & imaging results that were available during my care of the patient were reviewed by me and considered in my medical decision making (see chart for details).     Acute cystitis with hematuria  Vaginal bleeding  Abdominal pain, left lower quadrant  Possible UTI but can not completely rule out a vaginal source or diverticulitis. Will treat with Cipro to cover urine and  partially cover for diverticulitis. Still would keep follow up with GYN to rule out vaginal source.   Cipro 500 mg 1 twice daily for 7 days. Take with food.  Watch for worsening abdominal pain, fevers, chills, nausea, vomiting as this could indicate possible diverticulitis vs worsening infection.   Final Clinical Impressions(s) / UC Diagnoses   Final diagnoses:  None   Discharge Instructions   None    ED Prescriptions   None    PDMP not reviewed this encounter.   Landis Martins, PA-C 10/27/23 1045

## 2023-11-02 ENCOUNTER — Emergency Department (HOSPITAL_COMMUNITY): Payer: Medicare Other

## 2023-11-02 ENCOUNTER — Emergency Department (HOSPITAL_COMMUNITY)
Admission: EM | Admit: 2023-11-02 | Discharge: 2023-11-02 | Disposition: A | Discharge status: Home / Self Care | Payer: Medicare Other | Attending: Emergency Medicine | Admitting: Emergency Medicine

## 2023-11-02 ENCOUNTER — Encounter (HOSPITAL_COMMUNITY): Payer: Self-pay

## 2023-11-02 DIAGNOSIS — E876 Hypokalemia: Secondary | ICD-10-CM | POA: Diagnosis not present

## 2023-11-02 DIAGNOSIS — Z794 Long term (current) use of insulin: Secondary | ICD-10-CM | POA: Diagnosis not present

## 2023-11-02 DIAGNOSIS — N939 Abnormal uterine and vaginal bleeding, unspecified: Secondary | ICD-10-CM | POA: Diagnosis not present

## 2023-11-02 DIAGNOSIS — R1084 Generalized abdominal pain: Secondary | ICD-10-CM | POA: Diagnosis present

## 2023-11-02 LAB — URINALYSIS, W/ REFLEX TO CULTURE (INFECTION SUSPECTED)
Bilirubin Urine: NEGATIVE
Glucose, UA: NEGATIVE mg/dL
Ketones, ur: NEGATIVE mg/dL
Leukocytes,Ua: NEGATIVE
Nitrite: NEGATIVE
Protein, ur: 100 mg/dL — AB
RBC / HPF: >50 RBC/hpf (ref 0–5)
Specific Gravity, Urine: >1.046 — ABNORMAL HIGH (ref 1.005–1.030)
pH: 6 (ref 5.0–8.0)

## 2023-11-02 LAB — BASIC METABOLIC PANEL
Anion gap: 8 (ref 5–15)
BUN: 19 mg/dL (ref 8–23)
CO2: 26 mmol/L (ref 22–32)
Calcium: 9 mg/dL (ref 8.9–10.3)
Chloride: 102 mmol/L (ref 98–111)
Creatinine, Ser: 1.15 mg/dL — ABNORMAL HIGH (ref 0.44–1.00)
GFR, Estimated: 52 mL/min — ABNORMAL LOW (ref >60)
Glucose, Bld: 126 mg/dL — ABNORMAL HIGH (ref 70–99)
Potassium: 3.2 mmol/L — ABNORMAL LOW (ref 3.5–5.1)
Sodium: 136 mmol/L (ref 135–145)

## 2023-11-02 LAB — CBC WITH DIFFERENTIAL/PLATELET
Abs Immature Granulocytes: 0.01 10*3/uL (ref 0.00–0.07)
Basophils Absolute: 0 10*3/uL (ref 0.0–0.1)
Basophils Relative: 1 %
Eosinophils Absolute: 0 10*3/uL (ref 0.0–0.5)
Eosinophils Relative: 0 %
HCT: 43.1 % (ref 36.0–46.0)
Hemoglobin: 13.9 g/dL (ref 12.0–15.0)
Immature Granulocytes: 0 %
Lymphocytes Relative: 37 %
Lymphs Abs: 2.5 10*3/uL (ref 0.7–4.0)
MCH: 28.5 pg (ref 26.0–34.0)
MCHC: 32.3 g/dL (ref 30.0–36.0)
MCV: 88.5 fL (ref 80.0–100.0)
Monocytes Absolute: 0.4 10*3/uL (ref 0.1–1.0)
Monocytes Relative: 5 %
Neutro Abs: 3.9 10*3/uL (ref 1.7–7.7)
Neutrophils Relative %: 57 %
Platelets: 250 10*3/uL (ref 150–400)
RBC: 4.87 MIL/uL (ref 3.87–5.11)
RDW: 14.5 % (ref 11.5–15.5)
WBC: 6.8 10*3/uL (ref 4.0–10.5)
nRBC: 0 % (ref 0.0–0.2)

## 2023-11-02 MED ORDER — MORPHINE SULFATE (PF) 4 MG/ML IV SOLN
6.0000 mg | Freq: Once | INTRAVENOUS | Status: AC
  Administered 2023-11-02: 4 mg via INTRAVENOUS
  Filled 2023-11-02: qty 2

## 2023-11-02 MED ORDER — POTASSIUM CHLORIDE CRYS ER 20 MEQ PO TBCR
40.0000 meq | EXTENDED_RELEASE_TABLET | Freq: Once | ORAL | Status: AC
  Administered 2023-11-02: 40 meq via ORAL
  Filled 2023-11-02: qty 2

## 2023-11-02 MED ORDER — ONDANSETRON HCL 4 MG/2ML IJ SOLN
4.0000 mg | Freq: Once | INTRAMUSCULAR | Status: AC
  Administered 2023-11-02: 4 mg via INTRAVENOUS
  Filled 2023-11-02: qty 2

## 2023-11-02 MED ORDER — IOHEXOL 300 MG/ML  SOLN
100.0000 mL | Freq: Once | INTRAMUSCULAR | Status: AC | PRN
  Administered 2023-11-02: 100 mL via INTRAVENOUS

## 2023-11-02 NOTE — Discharge Instructions (Signed)
Return here for fever, vomiting, or any other problems

## 2023-11-02 NOTE — ED Triage Notes (Signed)
Pt c/o vaginal bleeding x1 week and generalized abdominal pain starting today.  Pt was diagnosed w/ a UTI on 11/10 and has almost completed a course of Cipro.  Pt already has an outpatient CT and outpatient [place] ordered.

## 2023-11-02 NOTE — ED Provider Notes (Signed)
[facility] Provider Note   CSN: [id number] Arrival date & time: 11/02/23  4098     History  Chief Complaint  Patient presents with   Abdominal Pain   Vaginal Bleeding    [name] is a 67 y.o. female.  Patient presents with left lower quadrant abdominal pain x 24 hours.  Patient is also having vaginal bleeding for about a week.  Has been seen by urgent care as well as her gynecologist.  Outpatient workup has been started for that.  Patient also recently diagnosed with possible diverticulitis and has been on ciprofloxacin.  Caregiver states that patient developed sharp pain is worse with movement x 1 day.  No current urinary symptoms.  No fever or chills.  No nausea or vomiting.  No prior history of same       Home Medications Prior to Admission medications   Medication Sig Start Date End Date Taking? Authorizing Provider  allopurinol (ZYLOPRIM) 100 MG tablet Take 1 tablet by mouth daily.    [provider]  benazepril (LOTENSIN) 20 MG tablet Take 20 mg by mouth daily.  01/19/19   [provider]  chlorthalidone (HYGROTON) 25 MG tablet Take 25 mg by mouth daily.  04/29/19   [provider]  cholecalciferol (VITAMIN D) 1000 units tablet Take 1,000 Units by mouth daily.    [provider]  ciprofloxacin (CIPRO) 500 MG tablet Take 1 tablet (500 mg total) by mouth 2 (two) times daily for 7 days. 10/27/23 11/03/23  Landis Martins, PA-C  dicyclomine (BENTYL) 20 MG tablet Take 0.5 tablets (10 mg total) by mouth 2 (two) times daily. 09/21/22   Tomi Bamberger, PA-C  Insulin Aspart (NOVOLOG [place]) Inject into the skin. SLIDING SCALE 5-12 UNITS    [provider]  metoprolol succinate (TOPROL-XL) 25 MG 24 hr tablet TAKE 1 TABLET(25 MG) BY MOUTH DAILY 05/11/22   Yates Decamp, MD  omeprazole (PRILOSEC) 40 MG capsule Take 40 mg by mouth daily as needed (indigestion).    [provider]   Potassium Chloride ER 20 MEQ TBCR Take 20 mEq by mouth daily. 12/20/18   [provider]  rosuvastatin (CRESTOR) 5 MG tablet Take 5 mg by mouth at bedtime.  12/19/18   [provider]  Semaglutide (RYBELSUS) 7 MG TABS Take by mouth daily.    [provider]  TRESIBA FLEXTOUCH 200 UNIT/ML FlexTouch Pen Inject into the skin. 09/16/23   [provider]      Allergies    Patient has no known allergies.    Review of Systems   Review of Systems  All other systems reviewed and are negative.   Physical Exam Updated Vital Signs BP (!) 154/93 (BP Location: Left Arm)   Pulse (!) 103   Temp 98.4 F (36.9 C) (Oral)   Resp 18   Ht 1.626 m (5\' 4" )   Wt 90.3 kg   SpO2 99%   BMI 34.16 kg/m  Physical Exam Vitals and nursing note reviewed.  Constitutional:      General: She is not in acute distress.    Appearance: Normal appearance. She is well-developed. She is not toxic-appearing.  HENT:     Head: Normocephalic and atraumatic.  Eyes:     General: Lids are normal.     Conjunctiva/sclera: Conjunctivae normal.     Pupils: Pupils are equal, round, and reactive to light.  Neck:     Thyroid: No thyroid mass.  Trachea: No tracheal deviation.  Cardiovascular:     Rate and Rhythm: Normal rate and regular rhythm.     Heart sounds: Normal heart sounds. No murmur heard.    No gallop.  Pulmonary:     Effort: Pulmonary effort is normal. No respiratory distress.     Breath sounds: Normal breath sounds. No stridor. No decreased breath sounds, wheezing, rhonchi or rales.  Abdominal:     General: There is no distension.     Palpations: Abdomen is soft.     Tenderness: There is abdominal tenderness in the left lower quadrant. There is no rebound.    Musculoskeletal:        General: No tenderness. Normal range of motion.     Cervical back: Normal range of motion and neck supple.  Skin:    General: Skin is warm and dry.     Findings: No abrasion or rash.   Neurological:     Mental Status: She is alert and oriented to person, place, and time. Mental status is at baseline.     GCS: GCS eye subscore is 4. GCS verbal subscore is 5. GCS motor subscore is 6.     Cranial Nerves: Cranial nerves are intact. No cranial nerve deficit.     Sensory: No sensory deficit.     Motor: Motor function is intact.  Psychiatric:        Attention and Perception: Attention normal.        Speech: Speech normal.        Behavior: Behavior normal.     ED Results / Procedures / Treatments   Labs (all labs ordered are listed, but only abnormal results are displayed) Labs Reviewed  CBC WITH DIFFERENTIAL/PLATELET  BASIC METABOLIC PANEL  URINALYSIS, W/ REFLEX TO CULTURE (INFECTION SUSPECTED)    EKG None  Radiology No results found.  Procedures Procedures    Medications Ordered in ED Medications  morphine (PF) 4 MG/ML injection 6 mg (has no administration in time range)  ondansetron (ZOFRAN) injection 4 mg (has no administration in time range)    ED Course/ Medical Decision Making/ A&P                                 Medical Decision Making Amount and/or Complexity of Data Reviewed Labs: ordered. Radiology: ordered.  Risk Prescription drug management.   Patient presented with abdominal pain concern for possible diverticulitis.  CT was negative for infection or kidney stone.  Urinalysis shows evidence of infection with hematuria.  White count is normal.  Mild hypokalemia noted and she was given oral potassium.  Patient given morphine for pain here. Plans for patient to be discharged and follow-up with her gynecologist and PCP       Final Clinical Impression(s) / ED Diagnoses Final diagnoses:  None    Rx / DC Orders ED Discharge Orders     None         Lorre Nick, MD 11/02/23 1123

## 2023-11-13 ENCOUNTER — Other Ambulatory Visit: Payer: Self-pay | Admitting: Obstetrics and Gynecology

## 2023-11-18 LAB — SURGICAL PATHOLOGY
